# Patient Record
Sex: Female | Born: 1970 | Race: White | Hispanic: No | Smoking: Former smoker
Health system: Southern US, Community
[De-identification: ages and names within clinical notes are randomized; demographics above are authoritative.]

## PROBLEM LIST (undated history)

## (undated) DIAGNOSIS — O24419 Gestational diabetes mellitus in pregnancy, unspecified control: Secondary | ICD-10-CM

## (undated) DIAGNOSIS — J189 Pneumonia, unspecified organism: Secondary | ICD-10-CM

## (undated) DIAGNOSIS — K635 Polyp of colon: Secondary | ICD-10-CM

## (undated) DIAGNOSIS — F32A Depression, unspecified: Secondary | ICD-10-CM

## (undated) DIAGNOSIS — F329 Major depressive disorder, single episode, unspecified: Secondary | ICD-10-CM

## (undated) DIAGNOSIS — T7840XA Allergy, unspecified, initial encounter: Secondary | ICD-10-CM

## (undated) DIAGNOSIS — I10 Essential (primary) hypertension: Secondary | ICD-10-CM

## (undated) DIAGNOSIS — U071 COVID-19: Secondary | ICD-10-CM

## (undated) DIAGNOSIS — K589 Irritable bowel syndrome without diarrhea: Secondary | ICD-10-CM

## (undated) DIAGNOSIS — M1990 Unspecified osteoarthritis, unspecified site: Secondary | ICD-10-CM

## (undated) DIAGNOSIS — F419 Anxiety disorder, unspecified: Secondary | ICD-10-CM

## (undated) HISTORY — DX: COVID-19: U07.1

## (undated) HISTORY — DX: Anxiety disorder, unspecified: F41.9

## (undated) HISTORY — DX: Allergy, unspecified, initial encounter: T78.40XA

## (undated) HISTORY — DX: Unspecified osteoarthritis, unspecified site: M19.90

## (undated) HISTORY — DX: Pneumonia, unspecified organism: J18.9

## (undated) HISTORY — DX: Irritable bowel syndrome, unspecified: K58.9

## (undated) HISTORY — DX: Major depressive disorder, single episode, unspecified: F32.9

## (undated) HISTORY — DX: Essential (primary) hypertension: I10

## (undated) HISTORY — DX: Depression, unspecified: F32.A

## (undated) HISTORY — PX: ESOPHAGOGASTRODUODENOSCOPY: SHX1529

## (undated) HISTORY — DX: Polyp of colon: K63.5

## (undated) HISTORY — DX: Gestational diabetes mellitus in pregnancy, unspecified control: O24.419

---

## 2011-11-20 ENCOUNTER — Ambulatory Visit (INDEPENDENT_AMBULATORY_CARE_PROVIDER_SITE_OTHER): Payer: BC Managed Care – PPO | Admitting: Licensed Clinical Social Worker

## 2011-11-20 DIAGNOSIS — F4323 Adjustment disorder with mixed anxiety and depressed mood: Secondary | ICD-10-CM

## 2011-11-28 ENCOUNTER — Ambulatory Visit (INDEPENDENT_AMBULATORY_CARE_PROVIDER_SITE_OTHER): Payer: BC Managed Care – PPO | Admitting: Licensed Clinical Social Worker

## 2011-11-28 DIAGNOSIS — F4323 Adjustment disorder with mixed anxiety and depressed mood: Secondary | ICD-10-CM

## 2011-12-05 ENCOUNTER — Ambulatory Visit (INDEPENDENT_AMBULATORY_CARE_PROVIDER_SITE_OTHER): Payer: BC Managed Care – PPO | Admitting: Licensed Clinical Social Worker

## 2011-12-05 DIAGNOSIS — F4323 Adjustment disorder with mixed anxiety and depressed mood: Secondary | ICD-10-CM

## 2011-12-11 ENCOUNTER — Ambulatory Visit (INDEPENDENT_AMBULATORY_CARE_PROVIDER_SITE_OTHER): Payer: BC Managed Care – PPO | Admitting: Licensed Clinical Social Worker

## 2011-12-11 DIAGNOSIS — F4323 Adjustment disorder with mixed anxiety and depressed mood: Secondary | ICD-10-CM

## 2011-12-20 ENCOUNTER — Ambulatory Visit (INDEPENDENT_AMBULATORY_CARE_PROVIDER_SITE_OTHER): Payer: BC Managed Care – PPO | Admitting: Licensed Clinical Social Worker

## 2011-12-20 DIAGNOSIS — F4323 Adjustment disorder with mixed anxiety and depressed mood: Secondary | ICD-10-CM

## 2012-01-03 ENCOUNTER — Ambulatory Visit (INDEPENDENT_AMBULATORY_CARE_PROVIDER_SITE_OTHER): Payer: BC Managed Care – PPO | Admitting: Licensed Clinical Social Worker

## 2012-01-03 DIAGNOSIS — F4323 Adjustment disorder with mixed anxiety and depressed mood: Secondary | ICD-10-CM

## 2012-01-16 ENCOUNTER — Ambulatory Visit (INDEPENDENT_AMBULATORY_CARE_PROVIDER_SITE_OTHER): Payer: BC Managed Care – PPO | Admitting: Licensed Clinical Social Worker

## 2012-01-16 DIAGNOSIS — F4323 Adjustment disorder with mixed anxiety and depressed mood: Secondary | ICD-10-CM

## 2012-02-12 ENCOUNTER — Ambulatory Visit (INDEPENDENT_AMBULATORY_CARE_PROVIDER_SITE_OTHER): Payer: BC Managed Care – PPO | Admitting: Licensed Clinical Social Worker

## 2012-02-12 DIAGNOSIS — F4323 Adjustment disorder with mixed anxiety and depressed mood: Secondary | ICD-10-CM

## 2012-02-27 ENCOUNTER — Ambulatory Visit (INDEPENDENT_AMBULATORY_CARE_PROVIDER_SITE_OTHER): Payer: BC Managed Care – PPO | Admitting: Licensed Clinical Social Worker

## 2012-02-27 DIAGNOSIS — F4323 Adjustment disorder with mixed anxiety and depressed mood: Secondary | ICD-10-CM

## 2012-03-13 ENCOUNTER — Ambulatory Visit (INDEPENDENT_AMBULATORY_CARE_PROVIDER_SITE_OTHER): Payer: BC Managed Care – PPO | Admitting: Licensed Clinical Social Worker

## 2012-03-13 DIAGNOSIS — F4323 Adjustment disorder with mixed anxiety and depressed mood: Secondary | ICD-10-CM

## 2012-03-27 ENCOUNTER — Ambulatory Visit (INDEPENDENT_AMBULATORY_CARE_PROVIDER_SITE_OTHER): Payer: BC Managed Care – PPO | Admitting: Licensed Clinical Social Worker

## 2012-03-27 DIAGNOSIS — F4323 Adjustment disorder with mixed anxiety and depressed mood: Secondary | ICD-10-CM

## 2012-04-10 ENCOUNTER — Ambulatory Visit (INDEPENDENT_AMBULATORY_CARE_PROVIDER_SITE_OTHER): Payer: BC Managed Care – PPO | Admitting: Licensed Clinical Social Worker

## 2012-04-10 DIAGNOSIS — F4323 Adjustment disorder with mixed anxiety and depressed mood: Secondary | ICD-10-CM

## 2012-04-23 ENCOUNTER — Ambulatory Visit (INDEPENDENT_AMBULATORY_CARE_PROVIDER_SITE_OTHER): Payer: BC Managed Care – PPO | Admitting: Licensed Clinical Social Worker

## 2012-04-23 DIAGNOSIS — F4323 Adjustment disorder with mixed anxiety and depressed mood: Secondary | ICD-10-CM

## 2012-05-07 ENCOUNTER — Ambulatory Visit (INDEPENDENT_AMBULATORY_CARE_PROVIDER_SITE_OTHER): Payer: BC Managed Care – PPO | Admitting: Licensed Clinical Social Worker

## 2012-05-07 DIAGNOSIS — F4323 Adjustment disorder with mixed anxiety and depressed mood: Secondary | ICD-10-CM

## 2012-06-02 ENCOUNTER — Ambulatory Visit (INDEPENDENT_AMBULATORY_CARE_PROVIDER_SITE_OTHER): Payer: BC Managed Care – PPO | Admitting: Licensed Clinical Social Worker

## 2012-06-02 DIAGNOSIS — F4323 Adjustment disorder with mixed anxiety and depressed mood: Secondary | ICD-10-CM

## 2012-06-24 ENCOUNTER — Ambulatory Visit (INDEPENDENT_AMBULATORY_CARE_PROVIDER_SITE_OTHER): Payer: BC Managed Care – PPO | Admitting: Licensed Clinical Social Worker

## 2012-06-24 DIAGNOSIS — F4323 Adjustment disorder with mixed anxiety and depressed mood: Secondary | ICD-10-CM

## 2012-07-10 ENCOUNTER — Ambulatory Visit: Payer: BC Managed Care – PPO | Admitting: Licensed Clinical Social Worker

## 2012-10-29 ENCOUNTER — Ambulatory Visit (INDEPENDENT_AMBULATORY_CARE_PROVIDER_SITE_OTHER): Payer: BC Managed Care – PPO | Admitting: Licensed Clinical Social Worker

## 2012-10-29 DIAGNOSIS — F4323 Adjustment disorder with mixed anxiety and depressed mood: Secondary | ICD-10-CM

## 2012-11-20 ENCOUNTER — Ambulatory Visit: Payer: BC Managed Care – PPO | Admitting: Licensed Clinical Social Worker

## 2012-11-27 ENCOUNTER — Ambulatory Visit (INDEPENDENT_AMBULATORY_CARE_PROVIDER_SITE_OTHER): Payer: BC Managed Care – PPO | Admitting: Licensed Clinical Social Worker

## 2012-11-27 DIAGNOSIS — F4323 Adjustment disorder with mixed anxiety and depressed mood: Secondary | ICD-10-CM

## 2013-01-01 ENCOUNTER — Ambulatory Visit (INDEPENDENT_AMBULATORY_CARE_PROVIDER_SITE_OTHER): Payer: BC Managed Care – PPO | Admitting: Licensed Clinical Social Worker

## 2013-01-01 DIAGNOSIS — F4323 Adjustment disorder with mixed anxiety and depressed mood: Secondary | ICD-10-CM

## 2013-02-05 ENCOUNTER — Ambulatory Visit: Payer: BC Managed Care – PPO | Admitting: Licensed Clinical Social Worker

## 2013-10-28 ENCOUNTER — Ambulatory Visit (INDEPENDENT_AMBULATORY_CARE_PROVIDER_SITE_OTHER): Payer: BC Managed Care – PPO | Admitting: Licensed Clinical Social Worker

## 2013-10-28 DIAGNOSIS — F4323 Adjustment disorder with mixed anxiety and depressed mood: Secondary | ICD-10-CM

## 2013-11-24 ENCOUNTER — Ambulatory Visit (INDEPENDENT_AMBULATORY_CARE_PROVIDER_SITE_OTHER): Payer: BC Managed Care – PPO | Admitting: Licensed Clinical Social Worker

## 2013-11-24 DIAGNOSIS — F4323 Adjustment disorder with mixed anxiety and depressed mood: Secondary | ICD-10-CM

## 2013-12-02 ENCOUNTER — Ambulatory Visit (INDEPENDENT_AMBULATORY_CARE_PROVIDER_SITE_OTHER): Payer: BC Managed Care – PPO | Admitting: Licensed Clinical Social Worker

## 2013-12-02 DIAGNOSIS — F4323 Adjustment disorder with mixed anxiety and depressed mood: Secondary | ICD-10-CM

## 2013-12-17 ENCOUNTER — Ambulatory Visit (INDEPENDENT_AMBULATORY_CARE_PROVIDER_SITE_OTHER): Payer: BC Managed Care – PPO | Admitting: Licensed Clinical Social Worker

## 2013-12-17 DIAGNOSIS — F4323 Adjustment disorder with mixed anxiety and depressed mood: Secondary | ICD-10-CM

## 2014-01-06 ENCOUNTER — Ambulatory Visit (INDEPENDENT_AMBULATORY_CARE_PROVIDER_SITE_OTHER): Payer: BC Managed Care – PPO | Admitting: Licensed Clinical Social Worker

## 2014-01-06 DIAGNOSIS — F4323 Adjustment disorder with mixed anxiety and depressed mood: Secondary | ICD-10-CM

## 2014-02-03 ENCOUNTER — Ambulatory Visit (INDEPENDENT_AMBULATORY_CARE_PROVIDER_SITE_OTHER): Payer: BC Managed Care – PPO | Admitting: Licensed Clinical Social Worker

## 2014-02-03 DIAGNOSIS — F4323 Adjustment disorder with mixed anxiety and depressed mood: Secondary | ICD-10-CM

## 2014-02-23 ENCOUNTER — Ambulatory Visit (INDEPENDENT_AMBULATORY_CARE_PROVIDER_SITE_OTHER): Payer: BC Managed Care – PPO | Admitting: Licensed Clinical Social Worker

## 2014-02-23 DIAGNOSIS — F4323 Adjustment disorder with mixed anxiety and depressed mood: Secondary | ICD-10-CM

## 2014-03-17 ENCOUNTER — Ambulatory Visit (INDEPENDENT_AMBULATORY_CARE_PROVIDER_SITE_OTHER): Payer: BC Managed Care – PPO | Admitting: Licensed Clinical Social Worker

## 2014-03-17 DIAGNOSIS — F4323 Adjustment disorder with mixed anxiety and depressed mood: Secondary | ICD-10-CM

## 2014-04-08 ENCOUNTER — Ambulatory Visit (INDEPENDENT_AMBULATORY_CARE_PROVIDER_SITE_OTHER): Payer: BC Managed Care – PPO | Admitting: Licensed Clinical Social Worker

## 2014-04-08 DIAGNOSIS — F4323 Adjustment disorder with mixed anxiety and depressed mood: Secondary | ICD-10-CM

## 2014-05-04 ENCOUNTER — Ambulatory Visit: Payer: BC Managed Care – PPO | Admitting: Licensed Clinical Social Worker

## 2014-05-13 ENCOUNTER — Ambulatory Visit (INDEPENDENT_AMBULATORY_CARE_PROVIDER_SITE_OTHER): Payer: BC Managed Care – PPO | Admitting: Licensed Clinical Social Worker

## 2014-05-13 DIAGNOSIS — F4323 Adjustment disorder with mixed anxiety and depressed mood: Secondary | ICD-10-CM

## 2014-06-10 ENCOUNTER — Ambulatory Visit: Payer: BC Managed Care – PPO | Admitting: Licensed Clinical Social Worker

## 2014-06-16 ENCOUNTER — Ambulatory Visit (INDEPENDENT_AMBULATORY_CARE_PROVIDER_SITE_OTHER): Payer: BC Managed Care – PPO | Admitting: Licensed Clinical Social Worker

## 2014-06-16 DIAGNOSIS — F4323 Adjustment disorder with mixed anxiety and depressed mood: Secondary | ICD-10-CM

## 2014-07-21 ENCOUNTER — Ambulatory Visit: Payer: BC Managed Care – PPO | Admitting: Licensed Clinical Social Worker

## 2014-12-14 ENCOUNTER — Ambulatory Visit (INDEPENDENT_AMBULATORY_CARE_PROVIDER_SITE_OTHER): Payer: BC Managed Care – PPO | Admitting: Licensed Clinical Social Worker

## 2014-12-14 DIAGNOSIS — F4323 Adjustment disorder with mixed anxiety and depressed mood: Secondary | ICD-10-CM | POA: Diagnosis not present

## 2014-12-28 ENCOUNTER — Ambulatory Visit (INDEPENDENT_AMBULATORY_CARE_PROVIDER_SITE_OTHER): Payer: BC Managed Care – PPO | Admitting: Licensed Clinical Social Worker

## 2014-12-28 DIAGNOSIS — F4323 Adjustment disorder with mixed anxiety and depressed mood: Secondary | ICD-10-CM | POA: Diagnosis not present

## 2015-01-11 ENCOUNTER — Ambulatory Visit (INDEPENDENT_AMBULATORY_CARE_PROVIDER_SITE_OTHER): Payer: BC Managed Care – PPO | Admitting: Licensed Clinical Social Worker

## 2015-01-11 DIAGNOSIS — F4323 Adjustment disorder with mixed anxiety and depressed mood: Secondary | ICD-10-CM | POA: Diagnosis not present

## 2016-03-30 ENCOUNTER — Ambulatory Visit (INDEPENDENT_AMBULATORY_CARE_PROVIDER_SITE_OTHER): Payer: BC Managed Care – PPO | Admitting: Family Medicine

## 2016-03-30 ENCOUNTER — Encounter: Payer: Self-pay | Admitting: Family Medicine

## 2016-03-30 VITALS — BP 116/76 | HR 74 | Temp 98.6°F | Ht 66.5 in | Wt 181.6 lb

## 2016-03-30 DIAGNOSIS — E663 Overweight: Secondary | ICD-10-CM

## 2016-03-30 DIAGNOSIS — G43709 Chronic migraine without aura, not intractable, without status migrainosus: Secondary | ICD-10-CM

## 2016-03-30 DIAGNOSIS — G43909 Migraine, unspecified, not intractable, without status migrainosus: Secondary | ICD-10-CM | POA: Insufficient documentation

## 2016-03-30 DIAGNOSIS — R0982 Postnasal drip: Secondary | ICD-10-CM | POA: Insufficient documentation

## 2016-03-30 DIAGNOSIS — M21619 Bunion of unspecified foot: Secondary | ICD-10-CM

## 2016-03-30 MED ORDER — VENLAFAXINE HCL ER 37.5 MG PO CP24: ORAL_CAPSULE | ORAL | 0 refills | Status: DC

## 2016-03-30 NOTE — Progress Notes (Signed)
   HPI  Patient presents today here to establish care.  Headaches Start with headaches for years, she previously took Topamax for about 2 years, however you to mental dullness stopped it. She is also using a beta blocker. She was controlled with Topamax and beta blocker. Good treatment with Imitrex Describes right-sided and sometimes bitemporal throbbing headaches that last all day, associated photophobia, worsening with sounds or smells, no aura. She has 3-4 migraines per week  Bunion Painful right foot bunion for over a year Difficulty with running Would like to have this addressed.  Complains of 2-3 months plus of intermittent right ear and right sore throat pain that started after each strip She does feel like she has nasal congestion and postnasal drip. She has not tried allergy medications.  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  PMH: Smoking status noted Medical history significant for allergy, anxiety, depression, gestational diabetes, hypertension Family history positive for dementia hyperlipidemia, hypertension Previous surgeries Former smoker, no drug use ROS: Per HPI  Objective: BP 116/76   Pulse 74   Temp 98.6 F (37 C) (Oral)   Ht 5' 6.5" (1.689 m)   Wt 181 lb 9.6 oz (82.4 kg)   BMI 28.87 kg/m  Gen: NAD, alert, cooperative with exam HEENT: NCAT, EOMI, PERRL, nares with some swelling and erythema, oropharynx clear, TMs normal bilaterally CV: RRR, good S1/S2, no murmur Resp: CTABL, no wheezes, non-labored Abd: SNTND, BS present, no guarding or organomegaly Ext: No edema, warm, large bunion on the first MTP of the right foot Neuro: Alert and oriented, No gross deficits  Assessment and plan:  # Migraines Worsening Starting prophylactic treatment with Effexor Continue Imitrex Discussed diet control Consider neurology referral if no improvement  # Overweight She is exercising and watching her diet Continue to monitor with starting Effexor Labs,  TSH  # Postnasal drip Flonase   # Bunion Painful bunion, physical exam significant for large bunion Likely will need a podiatrist attention, referral placed     Orders Placed This Encounter  Procedures  . Ambulatory referral to Podiatry    Referral Priority:   Routine    Referral Type:   Consultation    Referral Reason:   Specialty Services Required    Requested Specialty:   Podiatry    Number of Visits Requested:   1    Meds ordered this encounter  Medications  . SUMAtriptan (IMITREX) 100 MG tablet  . venlafaxine XR (EFFEXOR XR) 37.5 MG 24 hr capsule    Sig: 1 capsule daily for 1 week then 2 capsules daily    Dispense:  60 capsule    Refill:  0    Murtis SinkSam Camree Wigington, MD [facility] 03/30/2016, 3:02 PM

## 2016-03-30 NOTE — Patient Instructions (Signed)
Great to see you!  Start effexor for migraine prophylaxis, give this several weeks to kick in fully  Try flonase 2 sprays per nostril once daily for intermittent sore throat and ear pain.   We will call when a  Podiatrist is arranged.   Come back in 1 month

## 2016-04-23 ENCOUNTER — Encounter: Payer: Self-pay | Admitting: Family Medicine

## 2016-04-23 ENCOUNTER — Ambulatory Visit (INDEPENDENT_AMBULATORY_CARE_PROVIDER_SITE_OTHER): Payer: BC Managed Care – PPO | Admitting: Family Medicine

## 2016-04-23 VITALS — BP 120/76 | HR 72 | Temp 97.9°F | Ht 66.5 in | Wt 181.6 lb

## 2016-04-23 DIAGNOSIS — M21619 Bunion of unspecified foot: Secondary | ICD-10-CM | POA: Diagnosis not present

## 2016-04-23 DIAGNOSIS — G43709 Chronic migraine without aura, not intractable, without status migrainosus: Secondary | ICD-10-CM

## 2016-04-23 DIAGNOSIS — R0982 Postnasal drip: Secondary | ICD-10-CM | POA: Diagnosis not present

## 2016-04-23 MED ORDER — VENLAFAXINE HCL ER 75 MG PO CP24: 75.0000 mg | ORAL_CAPSULE | Freq: Every day | ORAL | 3 refills | Status: DC

## 2016-04-23 NOTE — Progress Notes (Signed)
   HPI  Patient presents today here to follow-up for headaches come bunion, postnasal drip.  Postnasal drip Symptoms stable to improved, using Nasacort No daily antihistamine.  Headache Doing better, having about one half as many headaches that she did previously. Tolerating Effexor without problem, no suicidal thoughts or mood disturbance.  Bunion Patient is down because she is been told by podiatry she needs to stop running, she is considering taking up cycling.   PMH: Smoking status noted ROS: Per HPI  Objective: BP 120/76   Pulse 72   Temp 97.9 F (36.6 C) (Oral)   Ht 5' 6.5" (1.689 m)   Wt 181 lb 9.6 oz (82.4 kg)   BMI 28.87 kg/m  Gen: NAD, alert, cooperative with exam HEENT: NCAT, EOMI, PERRL CV: RRR, good S1/S2, no murmur Resp: CTABL, no wheezes, non-labored Abd: SNTND, BS present, no guarding or organomegaly Ext: No edema, warm Neuro: Alert and oriented, No gross deficits  Assessment and plan:  # Headache Improving on Effexor, continue Follow-up 2 months Consider Depakote if needed, previously did not tolerate Topamax  # Bunion Limiting physical exercise, patient's understandably concerned about this Discussed alternative lower impact exercises Symptoms stable, appreciate podiatry's recommendations and management  # Postnasal drip Continue Nasacort, add daily antihistamine- Zyrtec    Meds ordered this encounter  Medications  . Triamcinolone Acetonide (NASACORT ALLERGY 24HR NA)    Sig: Place into the nose.  . venlafaxine XR (EFFEXOR-XR) 75 MG 24 hr capsule    Sig: Take 1 capsule (75 mg total) by mouth daily with breakfast.    Dispense:  30 capsule    Refill:  3    Murtis SinkSam Kurtiss Wence, MD [facility] 04/23/2016, 9:13 AM

## 2016-04-23 NOTE — Addendum Note (Signed)
Addended by: Prescott GumLAND, Deatra Mcmahen M on: 04/23/2016 09:25 AM   Modules accepted: Orders

## 2016-04-23 NOTE — Patient Instructions (Signed)
Great to see you!  Try plain zyrtec ( store brand is fine) 1 pill once daily.   Come back in 2 months

## 2016-04-24 LAB — CBC WITH DIFFERENTIAL/PLATELET
BASOS ABS: 0.1 10*3/uL (ref 0.0–0.2)
BASOS: 1 %
EOS (ABSOLUTE): 0.1 10*3/uL (ref 0.0–0.4)
Eos: 1 %
Hematocrit: 40.1 % (ref 34.0–46.6)
Hemoglobin: 14.1 g/dL (ref 11.1–15.9)
IMMATURE GRANS (ABS): 0 10*3/uL (ref 0.0–0.1)
IMMATURE GRANULOCYTES: 1 %
LYMPHS: 34 %
Lymphocytes Absolute: 2.1 10*3/uL (ref 0.7–3.1)
MCH: 29.7 pg (ref 26.6–33.0)
MCHC: 35.2 g/dL (ref 31.5–35.7)
MCV: 85 fL (ref 79–97)
MONOS ABS: 0.4 10*3/uL (ref 0.1–0.9)
Monocytes: 6 %
NEUTROS PCT: 57 %
Neutrophils Absolute: 3.6 10*3/uL (ref 1.4–7.0)
PLATELETS: 205 10*3/uL (ref 150–379)
RBC: 4.74 x10E6/uL (ref 3.77–5.28)
RDW: 12.6 % (ref 12.3–15.4)
WBC: 6.2 10*3/uL (ref 3.4–10.8)

## 2016-04-24 LAB — CMP14+EGFR
A/G RATIO: 1.6 (ref 1.2–2.2)
ALBUMIN: 4.5 g/dL (ref 3.5–5.5)
ALT: 22 IU/L (ref 0–32)
AST: 17 IU/L (ref 0–40)
Alkaline Phosphatase: 72 IU/L (ref 39–117)
BUN/Creatinine Ratio: 21 (ref 9–23)
BUN: 16 mg/dL (ref 6–24)
Bilirubin Total: 0.5 mg/dL (ref 0.0–1.2)
CALCIUM: 9.4 mg/dL (ref 8.7–10.2)
CO2: 23 mmol/L (ref 18–29)
CREATININE: 0.75 mg/dL (ref 0.57–1.00)
Chloride: 102 mmol/L (ref 96–106)
GFR, EST AFRICAN AMERICAN: 111 mL/min/{1.73_m2} (ref >59)
GFR, EST NON AFRICAN AMERICAN: 96 mL/min/{1.73_m2} (ref >59)
GLOBULIN, TOTAL: 2.8 g/dL (ref 1.5–4.5)
Glucose: 91 mg/dL (ref 65–99)
POTASSIUM: 4.4 mmol/L (ref 3.5–5.2)
SODIUM: 142 mmol/L (ref 134–144)
TOTAL PROTEIN: 7.3 g/dL (ref 6.0–8.5)

## 2016-04-24 LAB — LIPID PANEL
CHOLESTEROL TOTAL: 231 mg/dL — AB (ref 100–199)
Chol/HDL Ratio: 3 ratio units (ref 0.0–4.4)
HDL: 76 mg/dL (ref >39)
LDL Calculated: 138 mg/dL — ABNORMAL HIGH (ref 0–99)
TRIGLYCERIDES: 85 mg/dL (ref 0–149)
VLDL CHOLESTEROL CAL: 17 mg/dL (ref 5–40)

## 2016-04-24 LAB — TSH: TSH: 1.95 u[IU]/mL (ref 0.450–4.500)

## 2016-06-25 ENCOUNTER — Ambulatory Visit: Payer: BC Managed Care – PPO | Admitting: Family Medicine

## 2016-07-17 ENCOUNTER — Ambulatory Visit (INDEPENDENT_AMBULATORY_CARE_PROVIDER_SITE_OTHER): Payer: BC Managed Care – PPO | Admitting: Family Medicine

## 2016-07-17 ENCOUNTER — Encounter: Payer: Self-pay | Admitting: Family Medicine

## 2016-07-17 VITALS — BP 129/88 | HR 78 | Temp 97.9°F | Ht 66.5 in | Wt 190.2 lb

## 2016-07-17 DIAGNOSIS — G43709 Chronic migraine without aura, not intractable, without status migrainosus: Secondary | ICD-10-CM

## 2016-07-17 DIAGNOSIS — R635 Abnormal weight gain: Secondary | ICD-10-CM | POA: Diagnosis not present

## 2016-07-17 MED ORDER — VENLAFAXINE HCL ER 150 MG PO CP24: 150.0000 mg | ORAL_CAPSULE | Freq: Every day | ORAL | 3 refills | Status: DC

## 2016-07-17 NOTE — Progress Notes (Signed)
   HPI  Patient presents today for follow-up of chronic migraines.  Patient feels that Effexor is helping. She is now down to an average of one to 2 headaches per week. She had a severe headache during a recent snowstorm, she feels this is weather related. Sumatriptan is usually very effective. She has noticed a link between chocolate and migraines.  She still uses lots of caffeine.  Weight gain.  Patient has painful bunions, this has limited her exercise. She's watching her diet moderately. He is trying cycling but hasn't began to enjoy it yet.  PMH: Smoking status noted ROS: Per HPI  Objective: BP 129/88   Pulse 78   Temp 97.9 F (36.6 C) (Oral)   Ht 5' 6.5" (1.689 m)   Wt 190 lb 3.2 oz (86.3 kg)   BMI 30.24 kg/m  Gen: NAD, alert, cooperative with exam HEENT: NCAT CV: RRR, good S1/S2, no murmur Resp: CTABL, no wheezes, non-labored Ext: No edema, warm Neuro: Alert and oriented, No gross deficits  Assessment and plan:  # Chronic migraine Increase Effexor to 150 mg once daily, patient is down to average of 2 headaches per week, Continue Imitrex as needed Consider stopping caffeine completely   # Weight gain Discussed several lifestyle changes Cut out diet sodas, try spinning   Meds ordered this encounter  Medications  . venlafaxine XR (EFFEXOR-XR) 150 MG 24 hr capsule    Sig: Take 1 capsule (150 mg total) by mouth daily with breakfast.    Dispense:  30 capsule    Refill:  3    Murtis SinkSam Havish Petties, MD [facility] 07/17/2016, 5:06 PM

## 2016-07-17 NOTE — Patient Instructions (Signed)
Great to see you!  I have increased your effexor to 150 mg once daily.   Come back in 3-4 months for follow up of migraine headaches

## 2016-11-15 ENCOUNTER — Ambulatory Visit: Payer: BC Managed Care – PPO | Admitting: Family Medicine

## 2016-11-19 ENCOUNTER — Telehealth: Payer: Self-pay | Admitting: Family Medicine

## 2016-11-19 ENCOUNTER — Encounter: Payer: Self-pay | Admitting: Family Medicine

## 2016-11-19 NOTE — Telephone Encounter (Signed)
Left message to call back to r/s

## 2016-11-27 ENCOUNTER — Encounter: Payer: Self-pay | Admitting: Family Medicine

## 2016-11-27 ENCOUNTER — Ambulatory Visit (INDEPENDENT_AMBULATORY_CARE_PROVIDER_SITE_OTHER): Payer: BC Managed Care – PPO | Admitting: Family Medicine

## 2016-11-27 VITALS — BP 117/82 | HR 94 | Temp 98.0°F | Ht 66.5 in | Wt 192.2 lb

## 2016-11-27 DIAGNOSIS — R635 Abnormal weight gain: Secondary | ICD-10-CM

## 2016-11-27 DIAGNOSIS — N951 Menopausal and female climacteric states: Secondary | ICD-10-CM

## 2016-11-27 DIAGNOSIS — G43709 Chronic migraine without aura, not intractable, without status migrainosus: Secondary | ICD-10-CM

## 2016-11-27 MED ORDER — VENLAFAXINE HCL ER 37.5 MG PO CP24: ORAL_CAPSULE | ORAL | 0 refills | Status: DC

## 2016-11-27 MED ORDER — METOPROLOL SUCCINATE ER 50 MG PO TB24: 50.0000 mg | ORAL_TABLET | Freq: Every day | ORAL | 3 refills | Status: DC

## 2016-11-27 NOTE — Patient Instructions (Signed)
Great to see you!  Stop venlafaxine, ( take 75 mg daily for 2 weeks, then 37.5 mg for 2 weeks, then stop)  Start metoprolol 1 pill once daily.   Come back in 4-6 weeks.

## 2016-11-27 NOTE — Progress Notes (Signed)
   HPI  Patient presents today here to discuss headaches, weight gain  Weight gain Patient is previously very physically active, she has had some changes in that due to osteoarthritis of the right foot. She still watching her diet moderately. She states that she's gained about 20 pounds in one year.  Migraines Has had good improvement with Effexor, however concerned about leaking. Patient was doing much better for while, now she's having about one headache a day for the last month. She has tried Topamax which helped for a time and then quit. She does not want to try Topamax.    PMH: Smoking status noted ROS: Per HPI  Objective: BP 117/82   Pulse 94   Temp 98 F (36.7 C) (Oral)   Ht 5' 6.5" (1.689 m)   Wt 192 lb 3.2 oz (87.2 kg)   BMI 30.56 kg/m  Gen: NAD, alert, cooperative with exam HEENT: NCAT CV: RRR, good S1/S2, no murmur Resp: CTABL, no wheezes, non-labored Ext: No edema, warm Neuro: Alert and oriented, No gross deficits  Assessment and plan:  # Migraine headaches Stopping venlafaxine due to decreased effectiveness and concern about weight gain Cut down by half for 2 weeks, half again for 2 weeks then stop. Starting Toprol-XL 50 mg once daily. Offered headache clinic referral  # Weight gain Multifactorial Discussed venlafaxine can have a part of this Encouraged therapeutic left cell changes, discontinue venlafaxine.  Perimenopausal Long discussion about irregular periods, her mother went through menopause in her early 5650s, she's a few years earlier than that. Also with some hot flashes. Patient plans to see her GYN to discuss further  Meds ordered this encounter  Medications  . venlafaxine XR (EFFEXOR-XR) 37.5 MG 24 hr capsule    Sig: 2 pills daily for 2 weeks, then 1 pill daily for 2 weeks then stop    Dispense:  42 capsule    Refill:  0  . metoprolol succinate (TOPROL-XL) 50 MG 24 hr tablet    Sig: Take 1 tablet (50 mg total) by mouth daily. Take  with or immediately following a meal.    Dispense:  30 tablet    Refill:  3    Murtis SinkSam Bradshaw, MD [facility] 11/27/2016, 9:42 AM

## 2016-11-29 ENCOUNTER — Encounter: Payer: Self-pay | Admitting: Family Medicine

## 2016-11-29 ENCOUNTER — Encounter: Payer: Self-pay | Admitting: Neurology

## 2016-11-29 DIAGNOSIS — G43809 Other migraine, not intractable, without status migrainosus: Secondary | ICD-10-CM

## 2016-12-10 ENCOUNTER — Ambulatory Visit (INDEPENDENT_AMBULATORY_CARE_PROVIDER_SITE_OTHER): Payer: BC Managed Care – PPO | Admitting: Pediatrics

## 2016-12-10 ENCOUNTER — Encounter: Payer: Self-pay | Admitting: Pediatrics

## 2016-12-10 VITALS — BP 123/85 | HR 68 | Temp 98.7°F | Ht 66.5 in | Wt 193.0 lb

## 2016-12-10 DIAGNOSIS — L237 Allergic contact dermatitis due to plants, except food: Secondary | ICD-10-CM | POA: Diagnosis not present

## 2016-12-10 MED ORDER — HYDROCORTISONE 2.5 % EX CREA: TOPICAL_CREAM | Freq: Two times a day (BID) | CUTANEOUS | 0 refills | Status: DC

## 2016-12-10 NOTE — Patient Instructions (Signed)
Apply twice a day Wear sunscreen on areas with steroid because can cause sun sensitivity

## 2016-12-10 NOTE — Progress Notes (Signed)
  Subjective:   Patient ID: Sierra Pittman, female    DOB: 12/12/1970, 46 y.o.   MRN: [id number] CC: Poison Ivy (all over, one spot near eye started about week ago)  HPI: Sierra PikeHolly Cobert is a 46 y.o. female presenting for Poison Ivy (all over, one spot near eye started about week ago)  Has itchy areas in streaks on legs One place side of nose near L eye showed up yesterday Has been outside doing yard work regularly Using calamine, OTC hydrocortisone Helped some, but still feeling itchy off and on  No eye pain, no pain in itchy areas  Relevant past medical, surgical, family and social history reviewed. Allergies and medications reviewed and updated. History  Smoking Status  . Former Smoker  Smokeless Tobacco  . Never Used   ROS: Per HPI   Objective:    BP 123/85   Pulse 68   Temp 98.7 F (37.1 C) (Oral)   Ht 5' 6.5" (1.689 m)   Wt 193 lb (87.5 kg)   BMI 30.68 kg/m   Wt Readings from Last 3 Encounters:  12/10/16 193 lb (87.5 kg)  11/27/16 192 lb 3.2 oz (87.2 kg)  07/17/16 190 lb 3.2 oz (86.3 kg)    Gen: NAD, alert, cooperative with exam, NCAT EYES: EOMI, no conjunctival injection, or no icterus ENT:  TMs pearly gray b/l, OP without erythema LYMPH: no cervical LAD CV: WWP Resp:  normal WOB Ext: No edema, warm Neuro: Alert and oriented, strength equal b/l UE and LE, coordination grossly normal MSK: normal muscle bulk Skin: 1cm by 3mm raised slightly pink plaque L upper side of nose Several excorations b/l legs no vesicles   Assessment & Plan:  Jeanice LimHolly was seen today for poison ivy.  Diagnoses and all orders for this visit:  Poison ivy -     hydrocortisone 2.5 % cream; Apply topically 2 (two) times daily.   Follow up plan: Return if symptoms worsen or fail to improve. Rex Krasarol Essex Perry, MD [facility]

## 2016-12-14 ENCOUNTER — Ambulatory Visit: Payer: BC Managed Care – PPO | Admitting: Family

## 2017-01-07 ENCOUNTER — Ambulatory Visit: Payer: BC Managed Care – PPO | Admitting: Family Medicine

## 2017-03-08 ENCOUNTER — Encounter: Payer: Self-pay | Admitting: Neurology

## 2017-03-08 ENCOUNTER — Ambulatory Visit (INDEPENDENT_AMBULATORY_CARE_PROVIDER_SITE_OTHER): Payer: BC Managed Care – PPO | Admitting: Neurology

## 2017-03-08 VITALS — BP 108/80 | HR 72 | Ht 65.0 in | Wt 191.0 lb

## 2017-03-08 DIAGNOSIS — G43709 Chronic migraine without aura, not intractable, without status migrainosus: Secondary | ICD-10-CM | POA: Diagnosis not present

## 2017-03-08 MED ORDER — ONDANSETRON 4 MG PO TBDP: 4.0000 mg | ORAL_TABLET | Freq: Three times a day (TID) | ORAL | 2 refills | Status: DC | PRN

## 2017-03-08 NOTE — Progress Notes (Signed)
NEUROLOGY CONSULTATION NOTE  Sierra Pittman MRN: [id number] DOB: 01/11/1971  Referring provider: Dr. Ermalinda Memos Primary care provider: Dr. Ermalinda Memos  Reason for consult:  migraines  HISTORY OF PRESENT ILLNESS: Sierra Pittman is a 46 year old female who presents for migraine.  History supplemented by PCP note.  Onset:  High school Location:  Either from right trochlear region radiating to the right side or top of head radiating holocephalic Quality:  pounding Intensity:  Usually 5/10, severe 10/10 Aura:  no Prodrome:  no Postdrome:  no Associated symptoms:  Nausea, photophobia, phonophobia, osmophobia.  Severe migraines associated with vomiting..  She has not had any new worse headache of her life, waking up from sleep Duration:  Usually 12 hours untreated (less than 4 hours treated); severe migraines last 3 days Frequency:  4 to 5 days a week; severe migraines occur every few months Frequency of abortive medication: 4-5 days a week Triggers/exacerbating factors:  Smells (disinfectants, perfumes), foods (tree nuts, sausage/bacon), during pregnancy Relieving factors:  Laying down to rest. Activity:  Aggravates.  Cannot function at all if severe.  Past NSAIDS:  Excedrin Migraine Past analgesics:  Tylenol, Excedrin Migraine Past abortive triptans:  no Past muscle relaxants:  no Past anti-emetic:  Zofran (effective) Past antihypertensive medications:  Metoprolol  (took briefly while weaning off venlafaxine- stopped because she wasn't feeling well, but it could have been withdrawal effect from venlafaxine) Past antidepressant medications:  venlafaxine XR  (no longer effective, weight gain) Past anticonvulsant medications:  topiramate (cognitive changes, no longer effective) Past vitamins/Herbal/Supplements:  no Other past therapies:  no  Current NSAIDS:  Ibuprofen (rarely for body aches) Current analgesics:  no Current triptans:  sumatriptan  Current anti-emetic:   no Current muscle relaxants:  no Current anti-anxiolytic:  no Current sleep aide:  no Current Antihypertensive medications:  no Current Antidepressant medications:  no Current Anticonvulsant medications:  no Current Vitamins/Herbal/Supplements:  no Current Antihistamines/Decongestants:  no Other therapy:  no  Caffeine:  3 diet Mt[employer] Dew[employer] daily Alcohol:  rarely Smoker:  no Diet:  Hydrates but needs to improve.  Watches diet. Exercise:  yes Depression/anxiety:  Some anxiety and mild depression Sleep hygiene:  Wakes up in night and cannot fall back asleep Family history of headache:  Mother, maternal grandmother, sister  Most recent labs 04/23/16:  CMP with Na 142, K 4.4, Cl 102, CO2 23, glucose 91, BUN 16, Cr 0.75, t. bili 0.5, ALP 72, AST 17 and ALT 22.  PAST MEDICAL HISTORY: Past Medical History:  Diagnosis Date  . Allergy   . Anxiety   . Depression   . Gestational diabetes   . Hypertension     PAST SURGICAL HISTORY: History reviewed. No pertinent surgical history.  MEDICATIONS: Current Outpatient Prescriptions on File Prior to Visit  Medication Sig Dispense Refill  . hydrocortisone 2.5 % cream Apply topically 2 (two) times daily. (Patient not taking: Reported on 03/08/2017) 30 g 0  . metoprolol succinate (TOPROL-XL) 50 MG 24 hr tablet Take 1 tablet (50 mg total) by mouth daily. Take with or immediately following a meal. (Patient not taking: Reported on 03/08/2017) 30 tablet 3  . SUMAtriptan (IMITREX) 100 MG tablet     . Triamcinolone Acetonide (NASACORT ALLERGY 24HR NA) Place into the nose.     No current facility-administered medications on file prior to visit.     ALLERGIES: Allergies  Allergen Reactions  . Amoxicillin Rash  . Penicillins Rash    FAMILY HISTORY: Family History  Problem Relation Age  of Onset  . Hyperlipidemia Mother   . Hypertension Father   . Diabetes Father   . Hyperlipidemia Father   . Dementia Maternal Grandmother   . Prostate cancer  Maternal Grandfather   . Hypertension Paternal Grandmother   . Dementia Paternal Grandfather     SOCIAL HISTORY: Social History   Social History  . Marital status: Divorced    Spouse name: N/A  . Number of children: 2  . Years of education: N/A   Occupational History  . 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 Smoking status: Former Games developer  . Smokeless tobacco: Never Used  . Alcohol use Yes     Comment: occ  . Drug use: No  . Sexual activity: Not on file   Other Topics Concern  . Not on file   Social History Narrative   Divorced, lives with her 2 children in a 1 story house. Has 1 dog. Drinks 3 diet sodas a day. Exercises 3-5x a week at the gym. Has a bachelors degree. 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    REVIEW OF SYSTEMS: Constitutional: No fevers, chills, or sweats, no generalized fatigue, change in appetite Eyes: No visual changes, double vision, eye pain Ear, nose and throat: No hearing loss, ear pain, nasal congestion, sore throat Cardiovascular: No chest pain, palpitations Respiratory:  No shortness of breath at rest or with exertion, wheezes GastrointestinaI: No nausea, vomiting, diarrhea, abdominal pain, fecal incontinence Genitourinary:  No dysuria, urinary retention or frequency Musculoskeletal:  No neck pain, back pain Integumentary: No rash, pruritus, skin lesions Neurological: as above Psychiatric: No depression, insomnia, anxiety Endocrine: No palpitations, fatigue, diaphoresis, mood swings, change in appetite, change in weight, increased thirst Hematologic/Lymphatic:  No purpura, petechiae. Allergic/Immunologic: no itchy/runny eyes, nasal congestion, recent allergic reactions, rashes  PHYSICAL EXAM: Vitals:   03/08/17 0758  BP: 108/80  Pulse: 72  SpO2: 98%   General: No acute distress.  Patient appears well-groomed.  Head:  Normocephalic/atraumatic Eyes:  fundi examined but not visualized Neck:  supple, no paraspinal tenderness, full range of motion Back: No paraspinal tenderness Heart: regular rate and rhythm Lungs: Clear to auscultation bilaterally. Vascular: No carotid bruits. Neurological Exam: Mental status: alert and oriented to person, place, and time, recent and remote memory intact, fund of knowledge intact, attention and concentration intact, speech fluent and not dysarthric, language intact. Cranial nerves: CN I: not tested CN II: pupils equal, round and reactive to light, visual fields intact CN III, IV, VI:  full range of motion, no nystagmus, no ptosis CN V: facial sensation intact CN VII: upper and lower face symmetric CN VIII: hearing intact CN IX, X: gag intact, uvula midline CN XI: sternocleidomastoid and trapezius muscles intact CN XII: tongue midline Bulk & Tone: normal, no fasciculations. Motor:  5/5 throughout  Sensation: temperature and vibration sensation intact. Deep Tendon Reflexes:  2+ throughout, toes downgoing.  Finger to nose testing:  Without dysmetria.  Heel to shin:  Without dysmetria.  Gait:  Normal station and stride.  Able to turn and tandem walk. Romberg negative.  IMPRESSION: Chronic migraines  PLAN: 1.  Preauthorization for Botox.  She has over 3 months of over 15 headache/migraine days a month and has failed several preventatives (venlafaxine, topiramate, metoprolol) 2.  Sumatriptan  to  earliest onset of headache and may repeat after 2 hours if needed.  Limit to no more than 2 days out of week. 3.  Lifestyle modification:  Caffeine/soda cessation, increase water, continue exercise, sleep hygiene 4.  Consider magnesium, riboflavin, CoQ10 5.  Follow up for botox.  Thank you for allowing me to take part in the care of this patient.  Shon Millet, DO  CC: Kevin Fenton, MD

## 2017-03-08 NOTE — Patient Instructions (Signed)
Migraine Recommendations: 1.  We will get pre-authorization for Botox 2.  Take sumatriptan 50 to  at earliest onset of headache.  May repeat dose once in 2 hours if needed.  Do not exceed two doses in 24 hours.  For nausea, take Zofran ODT as needed/directed. 3.  Limit use of pain relievers to no more than 2 days out of the week.  These medications include acetaminophen, ibuprofen, triptans and narcotics.  This will help reduce risk of rebound headaches. 4.  Be aware of common food triggers such as processed sweets, processed foods with nitrites (such as deli meat, hot dogs, sausages), foods with MSG, alcohol (such as wine), chocolate, certain cheeses, certain fruits (dried fruits, some citrus fruit), vinegar, diet soda. 4.  Avoid caffeine 5.  Routine exercise 6.  Proper sleep hygiene 7.  Stay adequately hydrated with water 8.  Keep a headache diary. 9.  Maintain proper stress management. 10.  Do not skip meals. 11.  Consider supplements:  Magnesium citrate  to  daily, riboflavin , Coenzyme Q 10  three times daily 12.  Follow up for Botox.

## 2017-04-09 ENCOUNTER — Telehealth: Payer: Self-pay | Admitting: Neurology

## 2017-04-09 NOTE — Telephone Encounter (Signed)
Pt called and wanted to know how much her responsibility for the botox appointment she as far as what insurance will cover and not cover, she does not want a big bill when she gets her other wise she will cancel

## 2017-04-10 NOTE — Telephone Encounter (Signed)
Called [employer] spoke with Remi DeterKeira S., initiated PA for Botox. She checked coverage for [id number],[id number]-both are covered determination will take 2-3 business days DX: G43.709, reference #'s for: [id number] [phone] Fax clinicals to #[phone]-faxed I inquired on Pts out of pocket responsibilty, was trx to benefits [phone]) Spoke with Tess-will be buy and bill, Pt will have $85 copay. Called Pt and advsd her.

## 2017-04-12 NOTE — Telephone Encounter (Signed)
[facility], 1 visit valid 04/10/17-12/31/198 [id number] [phone]  Also rcvd a second approval for 1 visit valid 06/04/17-10/08/17 [phone]

## 2017-04-19 ENCOUNTER — Ambulatory Visit (INDEPENDENT_AMBULATORY_CARE_PROVIDER_SITE_OTHER): Payer: BC Managed Care – PPO | Admitting: Neurology

## 2017-04-19 DIAGNOSIS — G43709 Chronic migraine without aura, not intractable, without status migrainosus: Secondary | ICD-10-CM

## 2017-04-19 DIAGNOSIS — IMO0002 Reserved for concepts with insufficient information to code with codable children: Secondary | ICD-10-CM

## 2017-04-19 MED ORDER — ONABOTULINUMTOXINA 100 UNITS IJ SOLR
155.0000 [IU] | Freq: Once | INTRAMUSCULAR | Status: AC
  Administered 2017-04-19: 155 [IU] via INTRAMUSCULAR

## 2017-04-19 NOTE — Progress Notes (Signed)
[facility]   Procedure Note Botox  Attending: Dr. Sadarius Norman  Preoperative Diagnosis(es): Chronic migraine  Consent obtained from: The patient Benefits discussed included, but were not limited to decreased muscle tightness, increased joint range of motion, and decreased pain.  Risk discussed included, but were not limited pain and discomfort, bleeding, bruising, excessive weakness, venous thrombosis, muscle atrophy and dysphagia.  Anticipated outcomes of the procedure as well as he risks and benefits of the alternatives to the procedure, and the roles and tasks of the personnel to be involved, were discussed with the patient, and the patient consents to the procedure and agrees to proceed. A copy of the patient medication guide was given to the patient which explains the blackbox warning.  Patients identity and treatment sites confirmed Yes.  .  Details of Procedure: Skin was cleaned with alcohol. Prior to injection, the needle plunger was aspirated to make sure the needle was not within a blood vessel.  There was no blood retrieved on aspiration.    Following is a summary of the muscles injected  And the amount of Botulinum toxin used:  Dilution 200 units of Botox was reconstituted with 4 ml of preservative free normal saline. Time of reconstitution: At the time of the office visit (<30 minutes prior to injection)   Injections  155 total units of Botox was injected with a 30 gauge needle.  Injection Sites: L occipitalis: 15 units- 3 sites  R occiptalis: 15 units- 3 sites  L upper trapezius: 15 units- 3 sites R upper trapezius: 15 units- 3 sits          L paraspinal: 10 units- 2 sites R paraspinal: 10 units- 2 sites  Face L frontalis(2 injection sites):10 units   R frontalis(2 injection sites):10 units         L corrugator: 5 units   R corrugator: 5 units           Procerus: 5 units   L temporalis: 20 units R temporalis: 20 units   Agent:  200 units of botulinum Type  A (Onobotulinum Toxin type A) was reconstituted with 4 ml of preservative free normal saline.  Time of reconstitution: At the time of the office visit (<30 minutes prior to injection)     Total injected (Units): 155  Total wasted (Units): 45  Patient tolerated procedure well without complications.   Reinjection is anticipated in 3 months. Return to clinic in 4.5 months.  

## 2017-05-21 LAB — HM PAP SMEAR: HM Pap smear: NORMAL

## 2017-07-26 ENCOUNTER — Ambulatory Visit (INDEPENDENT_AMBULATORY_CARE_PROVIDER_SITE_OTHER): Payer: BC Managed Care – PPO | Admitting: Neurology

## 2017-07-26 DIAGNOSIS — G43709 Chronic migraine without aura, not intractable, without status migrainosus: Secondary | ICD-10-CM

## 2017-07-26 MED ORDER — ONABOTULINUMTOXINA 100 UNITS IJ SOLR
155.0000 [IU] | Freq: Once | INTRAMUSCULAR | Status: AC
  Administered 2017-07-26: 155 [IU] via INTRAMUSCULAR

## 2017-07-26 NOTE — Progress Notes (Signed)
[facility]   Procedure Note Botox  Attending: Dr. Shon MilletAdam Wells Gerdeman  Preoperative Diagnosis(es): Chronic migraine  Consent obtained from: The patient Benefits discussed included, but were not limited to decreased muscle tightness, increased joint range of motion, and decreased pain.  Risk discussed included, but were not limited pain and discomfort, bleeding, bruising, excessive weakness, venous thrombosis, muscle atrophy and dysphagia.  Anticipated outcomes of the procedure as well as he risks and benefits of the alternatives to the procedure, and the roles and tasks of the personnel to be involved, were discussed with the patient, and the patient consents to the procedure and agrees to proceed. A copy of the patient medication guide was given to the patient which explains the blackbox warning.  Patients identity and treatment sites confirmed Yes.  .  Details of Procedure: Skin was cleaned with alcohol. Prior to injection, the needle plunger was aspirated to make sure the needle was not within a blood vessel.  There was no blood retrieved on aspiration.    Following is a summary of the muscles injected  And the amount of Botulinum toxin used:  Dilution 200 units of Botox was reconstituted with 4 ml of preservative free normal saline. Time of reconstitution: At the time of the office visit (<30 minutes prior to injection)   Injections  155 total units of Botox was injected with a 30 gauge needle.  Injection Sites: L occipitalis: 15 units- 3 sites  R occiptalis: 15 units- 3 sites  L upper trapezius: 15 units- 3 sites R upper trapezius: 15 units- 3 sits          L paraspinal: 10 units- 2 sites R paraspinal: 10 units- 2 sites  Face L frontalis(2 injection sites):10 units   R frontalis(2 injection sites):10 units         L corrugator: 5 units   R corrugator: 5 units           Procerus: 5 units   L temporalis: 20 units R temporalis: 20 units   Agent:  200 units of botulinum Type  A (Onobotulinum Toxin type A) was reconstituted with 4 ml of preservative free normal saline.  Time of reconstitution: At the time of the office visit (<30 minutes prior to injection)     Total injected (Units): 155  Total wasted (Units): 0  Patient tolerated procedure well without complications.   Reinjection is anticipated in 3 months. Return to clinic in 6 weeks.

## 2017-08-26 ENCOUNTER — Encounter: Payer: Self-pay | Admitting: Neurology

## 2017-08-26 ENCOUNTER — Ambulatory Visit: Payer: BC Managed Care – PPO | Admitting: Neurology

## 2017-08-26 VITALS — BP 110/72 | HR 74 | Ht 65.0 in | Wt 180.8 lb

## 2017-08-26 DIAGNOSIS — G43009 Migraine without aura, not intractable, without status migrainosus: Secondary | ICD-10-CM

## 2017-08-26 MED ORDER — ELETRIPTAN HYDROBROMIDE 40 MG PO TABS: ORAL_TABLET | ORAL | 2 refills | Status: DC

## 2017-08-26 MED ORDER — PROPRANOLOL HCL ER 60 MG PO CP24: 60.0000 mg | ORAL_CAPSULE | Freq: Every day | ORAL | 2 refills | Status: DC

## 2017-08-26 NOTE — Patient Instructions (Signed)
Migraine Recommendations: 1.  Stop Botox.  Start propranolol ER 60mg  daily.  Call in 4 weeks with update and we can adjust dose if needed.  Monitor for lightheadedness. 2.  Stop sumatriptan.  Instead, take eletriptan 40mg  at earliest onset of headache.  May repeat dose once in 2 hours if needed.  Do not exceed two tablets in 24 hours. 3.  Limit use of pain relievers to no more than 2 days out of the week.  These medications include acetaminophen, ibuprofen, triptans and narcotics.  This will help reduce risk of rebound headaches. 4.  Be aware of common food triggers such as processed sweets, processed foods with nitrites (such as deli meat, hot dogs, sausages), foods with MSG, alcohol (such as wine), chocolate, certain cheeses, certain fruits (dried fruits, bananas, some citrus fruit), vinegar, diet soda. 4.  Avoid caffeine 5.  Routine exercise 6.  Proper sleep hygiene 7.  Stay adequately hydrated with water 8.  Keep a headache diary. 9.  Maintain proper stress management. 10.  Do not skip meals. 11.  Consider supplements:  Magnesium citrate 400mg  to 600mg  daily, riboflavin 400mg , Coenzyme Q 10 100mg  three times daily

## 2017-08-26 NOTE — Progress Notes (Signed)
NEUROLOGY FOLLOW UP OFFICE NOTE  Sierra Pittman [id number]  HISTORY OF PRESENT ILLNESS: Sierra Pittman is a 47 year old female who follows up for migraine.  UPDATE: Headaches have not had any significant improvement since initiating Botox. Intensity:  5/10 (10/10 severe) Duration:  4 hours to several hours Frequency:  10 days in one month Frequency of pain relievers:  infrequent Current NSAIDS:  Ibuprofen (rarely for body aches) Current analgesics:  no Current triptans:  sumatriptan 50mg -100mg  Current anti-emetic:  no Current muscle relaxants:  no Current anti-anxiolytic:  no Current sleep aide:  no Current Antihypertensive medications:  no Current Antidepressant medications:  no Current Anticonvulsant medications:  no Current Vitamins/Herbal/Supplements:  no Current Antihistamines/Decongestants:  no Other therapy:  Botox (status post 2 rounds)   Caffeine:  3 diet [employer] daily Alcohol:  rarely Smoker:  no Diet:  Hydrates but needs to improve.  Watches diet. Exercise:  yes Depression/anxiety:  Some anxiety and mild depression Sleep hygiene:  Wakes up in night and cannot fall back asleep   HISTORY: Onset:  High school Location:  Either from right trochlear region radiating to the right side or top of head radiating holocephalic Quality:  pounding Initial Intensity:  Usually 5/10, severe 10/10 Aura:  no Prodrome:  no Postdrome:  no Associated symptoms:  Nausea, photophobia, phonophobia, osmophobia.  Severe migraines associated with vomiting..  She has not had any new worse headache of her life, waking up from sleep Initial Duration:  Usually 12 hours untreated (less than 4 hours treated); severe migraines last 3 days Initial Frequency:  4 to 5 days a week; severe migraines occur every few months Initial Frequency of abortive medication: 4-5 days a week Triggers/exacerbating factors:  Smells (disinfectants, perfumes), foods (tree nuts, sausage/bacon), during  pregnancy Relieving factors:  Laying down to rest. Activity:  Aggravates.  Cannot function at all if severe.   Past NSAIDS:  Excedrin Migraine Past analgesics:  Tylenol, Excedrin Migraine Past abortive triptans:  no Past muscle relaxants:  no Past anti-emetic:  Zofran (effective) Past antihypertensive medications:  Metoprolol 50mg  (took briefly while weaning off venlafaxine- stopped because she wasn't feeling well, but it could have been withdrawal effect from venlafaxine) Past antidepressant medications:  venlafaxine XR 150mg  (no longer effective, weight gain) Past anticonvulsant medications:  topiramate (cognitive changes, no longer effective) Past vitamins/Herbal/Supplements:  no Other past therapies:  no   Family history of headache:  Mother, maternal grandmother, sister  PAST MEDICAL HISTORY: Past Medical History:  Diagnosis Date  . Allergy   . Anxiety   . Depression   . Gestational diabetes   . Hypertension     MEDICATIONS: Current Outpatient Medications on File Prior to Visit  Medication Sig Dispense Refill  . ondansetron (ZOFRAN ODT) 4 MG disintegrating tablet Take 1 tablet (4 mg total) by mouth every 8 (eight) hours as needed for nausea or vomiting. 20 tablet 2  . Triamcinolone Acetonide (NASACORT ALLERGY 24HR NA) Place into the nose.    . metoprolol succinate (TOPROL-XL) 50 MG 24 hr tablet Take 1 tablet (50 mg total) by mouth daily. Take with or immediately following a meal. (Patient not taking: Reported on 03/08/2017) 30 tablet 3   No current facility-administered medications on file prior to visit.     ALLERGIES: Allergies  Allergen Reactions  . Amoxicillin Rash  . Penicillins Rash    FAMILY HISTORY: Family History  Problem Relation Age of Onset  . Hyperlipidemia Mother   . Hypertension Father   . Diabetes  Father   . Hyperlipidemia Father   . Dementia Maternal Grandmother   . Prostate cancer Maternal Grandfather   . Hypertension Paternal Grandmother    . Dementia Paternal Grandfather     SOCIAL HISTORY: Social History   Socioeconomic History  . Marital status: Divorced    Spouse name: Not on file  . Number of children: 2  . Years of education: Not on file  . Highest education level: Not on file  Occupational History  . 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 Financial resource strain: Not on file  . Food insecurity:    Worry: Not on file    Inability: Not on file  . Transportation needs:    Medical: Not on file    Non-medical: Not on file  Tobacco Use  . Smoking status: Former Games developermoker  . Smokeless tobacco: Never Used  Substance and Sexual Activity  . Alcohol use: Yes    Comment: occ  . Drug use: No  . Sexual activity: Not on file  Lifestyle  . Physical activity:    Days per week: Not on file    Minutes per session: Not on file  . Stress: Not on file  Relationships  . Social connections:    Talks on phone: Not on file    Gets together: Not on file    Attends religious service: Not on file    Active member of club or organization: Not on file    Attends meetings of clubs or organizations: Not on file    Relationship status: Not on file  . Intimate partner violence:    Fear of current or ex partner: Not on file    Emotionally abused: Not on file    Physically abused: Not on file    Forced sexual activity: Not on file  Other Topics Concern  . Not on file  Social History Narrative   Divorced, lives with her 2 children in a 1 story house. Has 1 dog. Drinks 3 diet sodas a day. Exercises 3-5x a week at the gym. Has a bachelors degree. 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    REVIEW OF SYSTEMS: Constitutional: No fevers, chills, or sweats, no generalized fatigue, change in appetite Eyes: No visual changes, double vision, eye pain Ear, nose and throat: No hearing loss, ear pain, nasal congestion, sore throat Cardiovascular: No chest pain, palpitations Respiratory:   No shortness of breath at rest or with exertion, wheezes GastrointestinaI: No nausea, vomiting, diarrhea, abdominal pain, fecal incontinence Genitourinary:  No dysuria, urinary retention or frequency Musculoskeletal:  No neck pain, back pain Integumentary: No rash, pruritus, skin lesions Neurological: as above Psychiatric: No depression, insomnia, anxiety Endocrine: No palpitations, fatigue, diaphoresis, mood swings, change in appetite, change in weight, increased thirst Hematologic/Lymphatic:  No purpura, petechiae. Allergic/Immunologic: no itchy/runny eyes, nasal congestion, recent allergic reactions, rashes  PHYSICAL EXAM: Vitals:   08/26/17 0908  BP: 110/72  Pulse: 74  SpO2: 98%   General: No acute distress.  Patient appears well-groomed.   Head:  Normocephalic/atraumatic Eyes:  Fundi examined but not visualized Neck: supple, no paraspinal tenderness, full range of motion Heart:  Regular rate and rhythm Lungs:  Clear to auscultation bilaterally Back: No paraspinal tenderness Neurological Exam: alert and oriented to person, place, and time. Attention span and concentration intact, recent and remote memory intact, fund of knowledge intact.  Speech fluent and not dysarthric, language intact.  CN II-XII intact. Bulk and tone normal, muscle  strength 5/5 throughout.  Sensation to light touch  intact.  Deep tendon reflexes 2+ throughout.  Finger to nose testing intact.  Gait normal, Romberg negative.  IMPRESSION: Migraine without aura, not intractable  PLAN: 1.  Discontinue Botox.  Start propranolol ER 60mg  daily.  Call in 4 weeks with update and we can adjust dose if needed.  Monitor for lightheadedness.  2.  Stop sumatriptan.  Instead, take eletriptan 40mg  at earliest onset of headache.  May repeat dose once in 2 hours if needed.  Do not exceed two tablets in 24 hours. 3.  Limit use of pain relievers to no more than 2 days out of the week.  These medications include acetaminophen,  ibuprofen, triptans and narcotics.  This will help reduce risk of rebound headaches. 4.  Be aware of common food triggers such as processed sweets, processed foods with nitrites (such as deli meat, hot dogs, sausages), foods with MSG, alcohol (such as wine), chocolate, certain cheeses, certain fruits (dried fruits, bananas, some citrus fruit), vinegar, diet soda. 4.  Avoid caffeine 5.  Routine exercise 6.  Proper sleep hygiene 7.  Stay adequately hydrated with water 8.  Keep a headache diary. 9.  Maintain proper stress management. 10.  Do not skip meals. 11.  Consider supplements:  Magnesium citrate 400mg  to 600mg  daily, riboflavin 400mg , Coenzyme Q 10 100mg  three times daily 12.  Follow up in 3 months.  Shon Millet, DO  CC: Dr. Ermalinda Memos

## 2017-08-26 NOTE — Progress Notes (Signed)
Pt is stopping sumatriptan,changing to Relpax (eletriptan) 40 mg. Pt also started propranolol, which is the reason Maxalt (rizatriptan) was not tried instead of Relpax.

## 2017-08-27 NOTE — Progress Notes (Signed)
[facility] (p#[phone] f#[phone], stating needs PA. Called the number provided on fax, [phone] [facility]. She said does not require PA, it is a quantitiy vs time issue due to her filling sumatriptan w/in last 30 days. She transferred me to customer care [phone] [facility]. She was [facility], call disconnected. Called back, was transferred twice, finally spoke with Riki RuskJeremy in correct department, call disconnected mid-sentence. Called back, spoke with Wynona Caneshristine, she transferred me, call was disconnected again. Will try again tomorrow.

## 2017-09-27 ENCOUNTER — Encounter: Payer: Self-pay | Admitting: Neurology

## 2017-11-18 ENCOUNTER — Encounter: Payer: Self-pay | Admitting: Family Medicine

## 2017-11-18 ENCOUNTER — Ambulatory Visit (INDEPENDENT_AMBULATORY_CARE_PROVIDER_SITE_OTHER): Payer: BC Managed Care – PPO | Admitting: Family Medicine

## 2017-11-18 VITALS — BP 105/70 | HR 69 | Temp 99.3°F | Ht 65.0 in | Wt 178.0 lb

## 2017-11-18 DIAGNOSIS — Z0000 Encounter for general adult medical examination without abnormal findings: Secondary | ICD-10-CM | POA: Diagnosis not present

## 2017-11-18 DIAGNOSIS — Z91018 Allergy to other foods: Secondary | ICD-10-CM

## 2017-11-18 NOTE — Progress Notes (Signed)
   HPI  Patient presents today for annual physical exam.  Patient is working for migraine headaches with neurology, she is about to start propranolol, she uses sumatriptan with some good improvement She is also taking some supplements per their recommendations.  She is concerned about food allergies She has noticed when she eats eggs or tree nuts she has 1 to 2 days of persistent diarrhea or vomiting. It resolves after the food substance has cleared her system. She would like to see an allergist.  She is working out regularly, at least 3 hours a week, also watching her diet closely.  She feels good in general  PMH: Smoking status noted ROS: Per HPI  Objective: BP 105/70 (BP Location: Right Arm, Patient Position: Sitting, Cuff Size: Normal)   Pulse 69   Temp 99.3 F (37.4 C) (Oral)   Ht '5\' 5"'$  (1.651 m)   Wt 178 lb (80.7 kg)   BMI 29.62 kg/m  Gen: NAD, alert, cooperative with exam HEENT: NCAT, EOMI, PERRL, oropharynx moist and clear CV: RRR, good S1/S2, no murmur Resp: CTABL, no wheezes, non-labored Abd: SNTND, BS present, no guarding or organomegaly Ext: No edema, warm Neuro: Alert and oriented, No gross deficits  Assessment and plan:  #No physical exam Normal exam, weight but she has made very positive changes.  I encouraged her to continue. Labs today  #Food allergy Suspected food allergy with eggs and tree nuts Refer to allergy, appreciate their recommendations and evaluation   Orders Placed This Encounter  Procedures  . HM PAP SMEAR    This external order was created through the Results Console.  . CMP14+EGFR  . CBC with Differential/Platelet  . Lipid panel  . TSH  . Ambulatory referral to Allergy    Referral Priority:   Routine    Referral Type:   Allergy Testing    Referral Reason:   Specialty Services Required    Requested Specialty:   Allergy    Number of Visits Requested:   [place], MD [facility] 11/18/2017,  10:27 AM

## 2017-11-18 NOTE — Patient Instructions (Signed)
Great to see you!   

## 2017-11-19 LAB — CBC WITH DIFFERENTIAL/PLATELET
BASOS ABS: 0 10*3/uL (ref 0.0–0.2)
Basos: 1 %
EOS (ABSOLUTE): 0.1 10*3/uL (ref 0.0–0.4)
Eos: 2 %
HEMOGLOBIN: 14.4 g/dL (ref 11.1–15.9)
Hematocrit: 41.5 % (ref 34.0–46.6)
Immature Grans (Abs): 0 10*3/uL (ref 0.0–0.1)
Immature Granulocytes: 0 %
LYMPHS ABS: 1.5 10*3/uL (ref 0.7–3.1)
Lymphs: 32 %
MCH: 29.5 pg (ref 26.6–33.0)
MCHC: 34.7 g/dL (ref 31.5–35.7)
MCV: 85 fL (ref 79–97)
MONOCYTES: 6 %
MONOS ABS: 0.3 10*3/uL (ref 0.1–0.9)
NEUTROS ABS: 2.8 10*3/uL (ref 1.4–7.0)
Neutrophils: 59 %
Platelets: 187 10*3/uL (ref 150–450)
RBC: 4.88 x10E6/uL (ref 3.77–5.28)
RDW: 13 % (ref 12.3–15.4)
WBC: 4.7 10*3/uL (ref 3.4–10.8)

## 2017-11-19 LAB — CMP14+EGFR
ALBUMIN: 4.4 g/dL (ref 3.5–5.5)
ALT: 16 IU/L (ref 0–32)
AST: 19 IU/L (ref 0–40)
Albumin/Globulin Ratio: 1.7 (ref 1.2–2.2)
Alkaline Phosphatase: 73 IU/L (ref 39–117)
BUN / CREAT RATIO: 22 (ref 9–23)
BUN: 17 mg/dL (ref 6–24)
Bilirubin Total: 0.6 mg/dL (ref 0.0–1.2)
CO2: 24 mmol/L (ref 20–29)
CREATININE: 0.79 mg/dL (ref 0.57–1.00)
Calcium: 9.5 mg/dL (ref 8.7–10.2)
Chloride: 105 mmol/L (ref 96–106)
GFR calc non Af Amer: 89 mL/min/{1.73_m2} (ref >59)
GFR, EST AFRICAN AMERICAN: 102 mL/min/{1.73_m2} (ref >59)
GLUCOSE: 87 mg/dL (ref 65–99)
Globulin, Total: 2.6 g/dL (ref 1.5–4.5)
Potassium: 4.2 mmol/L (ref 3.5–5.2)
Sodium: 140 mmol/L (ref 134–144)
TOTAL PROTEIN: 7 g/dL (ref 6.0–8.5)

## 2017-11-19 LAB — LIPID PANEL
CHOLESTEROL TOTAL: 217 mg/dL — AB (ref 100–199)
Chol/HDL Ratio: 3.3 ratio (ref 0.0–4.4)
HDL: 65 mg/dL (ref >39)
LDL CALC: 135 mg/dL — AB (ref 0–99)
Triglycerides: 87 mg/dL (ref 0–149)
VLDL Cholesterol Cal: 17 mg/dL (ref 5–40)

## 2017-11-19 LAB — TSH: TSH: 2.1 u[IU]/mL (ref 0.450–4.500)

## 2017-12-02 ENCOUNTER — Ambulatory Visit: Payer: Self-pay | Admitting: Neurology

## 2018-01-07 ENCOUNTER — Ambulatory Visit: Payer: BC Managed Care – PPO | Admitting: Allergy and Immunology

## 2018-01-07 ENCOUNTER — Encounter: Payer: Self-pay | Admitting: Allergy and Immunology

## 2018-01-07 VITALS — BP 130/86 | HR 86 | Temp 98.6°F | Resp 16 | Ht 65.0 in | Wt 178.0 lb

## 2018-01-07 DIAGNOSIS — J3089 Other allergic rhinitis: Secondary | ICD-10-CM

## 2018-01-07 DIAGNOSIS — R1907 Generalized intra-abdominal and pelvic swelling, mass and lump: Secondary | ICD-10-CM | POA: Diagnosis not present

## 2018-01-07 DIAGNOSIS — R1110 Vomiting, unspecified: Secondary | ICD-10-CM | POA: Diagnosis not present

## 2018-01-07 DIAGNOSIS — T783XXA Angioneurotic edema, initial encounter: Secondary | ICD-10-CM | POA: Diagnosis not present

## 2018-01-07 NOTE — Patient Instructions (Addendum)
  1.  Allergen avoidance measures?  2.  Try a preventative plan of the following:   A.  Loratadine 10 mg -2 tablets once a day  B.  Ranitidine 150 mg -2 tablets once a day  3.  Investigation:    A. alpha gal panel, C4, tryptase, nut panel with reflex, egg panel with reflex  4.  Further investigation?  Yes if recurrent reactions  5.  Return to clinic in 12 weeks or earlier if problem

## 2018-01-07 NOTE — Progress Notes (Signed)
Dear Dr. Ermalinda MemosBradshaw,  Thank you for referring Sierra Pittman to the [facility] on 01/07/2018.   Below is a summation of this patient's evaluation and recommendations.  Thank you for your referral. I will keep you informed about this patient's response to treatment.   If you have any questions please do not hesitate to contact me.   Sincerely,  Jessica PriestEric J. Tinnie Kunin, MD Allergy / Immunology [facility]   ______________________________________________________________________    NEW PATIENT NOTE  Referring Provider: Elenora GammaBradshaw, Samuel L, MD Primary Provider: Elenora GammaBradshaw, Samuel L, MD Date of office visit: 01/07/2018    Subjective:   Chief Complaint:  Sierra Pittman (DOB: 12/19/1970) is a 47 y.o. female who presents to the clinic on 01/07/2018 with a chief complaint of Food Intolerance .     HPI: Sierra Pittman presents to this clinic in evaluation of 2 main issues.  First, she has a multiyear history of having intermittent explosive vomiting and diarrhea with a frequency of approximately 1 time per month although this past February and March this appeared to occur every week.  Usually she would have this explosive vomiting and diarrhea that would last an hour or so and then she would have burping that would go on for a day or 2.  She is not exactly sure what the trigger is for this issue.  She thinks that egg consumption may be precipitating these events and she thinks that tree nut consumption especially pecan can trigger this issue.  There appears to be a multi hour delay though regarding consumption of these foods and the development of these issues and sometimes the delay can be 12 hours.  But there are multiple episodes that do not have any obvious trigger.  She did have investigation with a gastroenterologist several years ago and had an upper endoscopy and apparently she had Helicobacter pylori and was treated  appropriately for that condition.  There is no associated systemic or constitutional symptoms.  Specifically, she does not develop any hives or lip swelling or tongue swelling or respiratory tract symptoms with these episodes.  Second, she does have a history of very mild nasal congestion and sneezing on an intermittent basis may be during the spring and fall that does not require any specific therapy.  Past Medical History:  Diagnosis Date  . Allergy   . Anxiety   . Depression   . Gestational diabetes   . Hypertension     History reviewed. No pertinent surgical history.  Allergies as of 01/07/2018      Reactions   Amoxicillin Rash   Penicillins Rash      Medication List      Co Q 10 100 MG Caps Take by mouth.   eletriptan 40 MG tablet Commonly known as:  RELPAX Take 1 tablet earliest onset of migraine.  May repeat once in 2 hours if headache persists or recurs.   Magnesium 400 MG Caps Take 400 mg by mouth daily.   metoprolol succinate 50 MG 24 hr tablet Commonly known as:  TOPROL-XL Take 1 tablet (50 mg total) by mouth daily. Take with or immediately following a meal.   NASACORT ALLERGY 24HR NA Place into the nose.   ondansetron 4 MG disintegrating tablet Commonly known as:  ZOFRAN ODT Take 1 tablet (4 mg total) by mouth every 8 (eight) hours as needed for nausea or vomiting.   propranolol ER 60 MG 24 hr  capsule Commonly known as:  INDERAL LA Take 1 capsule (60 mg total) by mouth daily.   riboflavin 100 MG Tabs tablet Commonly known as:  VITAMIN B-2 Take 400 mg by mouth daily.   SUMAtriptan 100 MG tablet Commonly known as:  IMITREX Take 100 mg by mouth once as needed.       Review of systems negative except as noted in HPI / PMHx or noted below:  Review of Systems  Constitutional: Negative.   HENT: Negative.   Eyes: Negative.   Respiratory: Negative.   Cardiovascular: Negative.   Gastrointestinal: Negative.   Genitourinary: Negative.     Musculoskeletal: Negative.   Skin: Negative.   Neurological: Negative.   Endo/Heme/Allergies: Negative.   Psychiatric/Behavioral: Negative.     Family History  Problem Relation Age of Onset  . Hyperlipidemia Mother   . Hypertension Father   . Diabetes Father   . Hyperlipidemia Father   . Dementia Maternal Grandmother   . Prostate cancer Maternal Grandfather   . Hypertension Paternal Grandmother   . Dementia Paternal Grandfather   . Hyperlipidemia Brother   . Hyperlipidemia Brother   . Hyperlipidemia Brother     Social History   Socioeconomic History  . Marital status: Divorced    Spouse name: Not on file  . Number of children: 2  . Years of education: Not on file  . Highest education level: Not on file  Occupational History  . 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 Financial resource strain: Not on file  . Food insecurity:    Worry: Not on file    Inability: Not on file  . Transportation needs:    Medical: Not on file    Non-medical: Not on file  Tobacco Use  . Smoking status: Former Smoker    Last attempt to quit: 11/18/2000    Years since quitting: 17.1  . Smokeless tobacco: Never Used  Substance and Sexual Activity  . Alcohol use: Yes    Comment: occ  . Drug use: No  . Sexual activity: Yes    Birth control/protection: Other-see comments    Comment: hasn't had a period in over a year  Lifestyle  . Physical activity:    Days per week: Not on file    Minutes per session: Not on file  . Stress: Not on file  Relationships  . Social connections:    Talks on phone: Not on file    Gets together: Not on file    Attends religious service: Not on file    Active member of club or organization: Not on file    Attends meetings of clubs or organizations: Not on file    Relationship status: Not on file  . Intimate partner violence:    Fear of current or ex partner: Not on file    Emotionally abused: Not on file     Physically abused: Not on file    Forced sexual activity: Not on file  Other Topics Concern  . Not on file  Social History Narrative   Divorced, lives with her 2 children in a 1 story house. Has 1 dog. Drinks 3 diet sodas a day. Exercises 3-5x a week at the gym. Has a bachelors degree. 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    Environmental and Social history  Lives in a house with a dry environment, a dog located inside the household, no carpet in the bedroom, no plastic on the bed, no  plastic on the pillow, and no smokers located inside the household.  She is a Manufacturing systems engineer.  Objective:   Vitals:   01/07/18 0911  BP: 130/86  Pulse: 86  Resp: 16  Temp: 98.6 F (37 C)  SpO2: 99%   Height: 5\' 5"  (165.1 cm) Weight: 178 lb (80.7 kg)  Physical Exam  HENT:  Head: Normocephalic.  Right Ear: Tympanic membrane, external ear and ear canal normal.  Left Ear: Tympanic membrane, external ear and ear canal normal.  Nose: Nose normal. No mucosal edema or rhinorrhea.  Mouth/Throat: Uvula is midline, oropharynx is clear and moist and mucous membranes are normal. No oropharyngeal exudate.  Eyes: Conjunctivae are normal.  Neck: Trachea normal. No tracheal tenderness present. No tracheal deviation present. No thyromegaly present.  Cardiovascular: Normal rate, regular rhythm, S1 normal, S2 normal and normal heart sounds.  No murmur heard. Pulmonary/Chest: Breath sounds normal. No stridor. No respiratory distress. She has no wheezes. She has no rales.  Musculoskeletal: She exhibits no edema.  Lymphadenopathy:       Head (right side): No tonsillar adenopathy present.       Head (left side): No tonsillar adenopathy present.    She has no cervical adenopathy.  Neurological: She is alert.  Skin: No rash noted. She is not diaphoretic. No erythema. Nails show no clubbing.    Diagnostics: Allergy skin tests were performed.  She did not demonstrate any hypersensitivity against a screening  panel of aeroallergens or foods.  Assessment and Plan:    1. Abdominal swelling, generalized   2. Angioedema, initial encounter   3. Recurrent vomiting   4. Other allergic rhinitis     1.  Allergen avoidance measures?  2.  Try a preventative plan of the following:   A.  Loratadine 10 mg -2 tablets once a day  B.  Ranitidine 150 mg -2 tablets once a day  3.  Investigation:    A. alpha gal panel, C4, tryptase, nut panel with reflex, egg panel with reflex  4.  Further investigation?  Yes if recurrent reactions  5.  Return to clinic in 12 weeks or earlier if problem  Although Dorianna does have a history consistent with some degree of atopic disease involving her upper airway and conjunctiva there does not appear to be an atopic trigger for her GI issue.  I have ordered some blood tests in investigation of other etiologic factors that can contribute to her GI complaints and also to work through in a little bit more detail whether or not atopic disease is contributing to this issue.  She can try a combination of an H1 and H2 receptor blocker to see if this helps her issue.  She will keep in contact with me noting her response to this approach as she moves forward and I will regroup with her in 12 weeks or earlier if there is a problem.  Jessica Priest, MD Allergy / Immunology [facility] [place]

## 2018-01-08 ENCOUNTER — Encounter: Payer: Self-pay | Admitting: Allergy and Immunology

## 2018-01-10 LAB — ALPHA-GAL PANEL
BEEF CLASS INTERPRETATION: 0
Beef (Bos spp) IgE: <0.1 kU/L (ref <0.35)
LAMB CLASS INTERPRETATION: 0
PORK CLASS INTERPRETATION: 0

## 2018-01-10 LAB — IGE NUT PROF. W/COMPONENT RFLX
F020-IgE Almond: <0.1 kU/L
F203-IgE Pistachio Nut: <0.1 kU/L
Hazelnut (Filbert) IgE: <0.1 kU/L
Macadamia Nut, IgE: <0.1 kU/L
Peanut, IgE: <0.1 kU/L

## 2018-01-10 LAB — IGE EGG WHITE W/COMPONENT RFLX: Egg White IgE: <0.1 kU/L

## 2018-01-10 LAB — C4 COMPLEMENT: Complement C4, Serum: 25 mg/dL (ref 14–44)

## 2018-01-15 LAB — TRYPTASE: Tryptase: 3.9 ug/L (ref 2.2–13.2)

## 2018-01-15 LAB — SPECIMEN STATUS REPORT

## 2018-04-01 ENCOUNTER — Ambulatory Visit: Payer: BC Managed Care – PPO | Admitting: Allergy and Immunology

## 2018-06-24 ENCOUNTER — Ambulatory Visit: Payer: BC Managed Care – PPO | Admitting: Family Medicine

## 2018-06-24 VITALS — BP 131/92 | HR 68 | Temp 97.8°F | Ht 65.0 in | Wt 178.0 lb

## 2018-06-24 DIAGNOSIS — G8929 Other chronic pain: Secondary | ICD-10-CM | POA: Diagnosis not present

## 2018-06-24 DIAGNOSIS — R030 Elevated blood-pressure reading, without diagnosis of hypertension: Secondary | ICD-10-CM | POA: Diagnosis not present

## 2018-06-24 DIAGNOSIS — B070 Plantar wart: Secondary | ICD-10-CM | POA: Diagnosis not present

## 2018-06-24 DIAGNOSIS — M25561 Pain in right knee: Secondary | ICD-10-CM | POA: Diagnosis not present

## 2018-06-24 MED ORDER — DICLOFENAC SODIUM 1 % TD GEL: 4.0000 g | Freq: Four times a day (QID) | TRANSDERMAL | 1 refills | Status: DC

## 2018-06-24 NOTE — Patient Instructions (Signed)
Use the duct tape method for the plantar wart.  If you change your mind about freezing it, call the office and we can do that here.  For the knee, this is likely meniscus.  I have included more information.  I am sending the voltaren gel that you can use daily.  If symptoms worsen, we can refer you to the specialist.  I am optimistic about you improving without additional intervention.   Meniscus Tear  A meniscus tear is a knee injury that happens when a piece of the meniscus is torn. The meniscus is a thick, rubbery, wedge-shaped cartilage in the knee. Two menisci are located in each knee. They sit between the upper bone (femur) and lower bone (tibia) that make up the knee joint. Each meniscus acts as a shock absorber for the knee. A torn meniscus is one of the most common types of knee injuries. This injury can range from mild to severe. Surgery may be needed to repair a severe tear. What are the causes? This condition may be caused by any kneeling, squatting, twisting, or pivoting movement. Sports-related injuries are the most common cause. These often occur from:  Running and stopping suddenly. ? Changing direction. ? Being tackled or knocked off your feet.  Lifting or carrying heavy weights. As people get older, their menisci get thinner and weaker. In these people, tears can happen more easily, such as from climbing stairs. What increases the risk? You are more likely to develop this condition if you:  Play contact sports.  Have a job that requires kneeling or squatting.  Are female.  Are over 43 years old. What are the signs or symptoms? Symptoms of this condition include:  Knee pain, especially at the side of the knee joint. You may feel pain when the injury occurs, or you may only hear a pop and feel pain later.  A feeling that your knee is clicking, catching, locking, or giving way.  Not being able to fully bend or extend your knee.  Bruising or swelling in your  knee. How is this diagnosed? This condition may be diagnosed based on your symptoms and a physical exam. You may also have tests, such as:  X-rays.  MRI.  A procedure to look inside your knee with a narrow surgical telescope (arthroscopy). You may be referred to a knee specialist (orthopedic surgeon). How is this treated? Treatment for this injury depends on the severity of the tear. Treatment for a mild tear may include:  Rest.  Medicine to reduce pain and swelling. This is usually a nonsteroidal anti-inflammatory drug (NSAID), like ibuprofen.  A knee brace, sleeve, or wrap.  Using crutches or a walker to keep weight off your knee and to help you walk.  Exercises to strengthen your knee (physical therapy). You may need surgery if you have a severe tear or if other treatments are not working. Follow these instructions at home: If you have a brace, sleeve, or wrap:  Wear it as told by your health care provider. Remove it only as told by your health care provider.  Loosen the brace, sleeve, or wrap if your toes tingle, become numb, or turn cold and blue.  Keep the brace, sleeve, or wrap clean and dry.  If the brace, sleeve, or wrap is not waterproof: ? Do not let it get wet. ? Cover it with a watertight covering when you take a bath or shower. Managing pain and swelling   Take over-the-counter and prescription medicines only as  told by your health care provider.  If directed, put ice on your knee: ? If you have a removable brace, sleeve, or wrap, remove it as told by your health care provider. ? Put ice in a plastic bag. ? Place a towel between your skin and the bag. ? Leave the ice on for 20 minutes, 2-3 times per day.  Move your toes often to avoid stiffness and to lessen swelling.  Raise (elevate) the injured area above the level of your heart while you are sitting or lying down. Activity  Do not use the injured limb to support your body weight until your health  care provider says that you can. Use crutches or a walker as told by your health care provider.  Return to your normal activities as told by your health care provider. Ask your health care provider what activities are safe for you.  Perform range-of-motion exercises only as told by your health care provider.  Begin doing exercises to strengthen your knee and leg muscles only as told by your health care provider. After you recover, your health care provider may recommend these exercises to help prevent another injury. General instructions  Use a knee brace, sleeve, or wrap as told by your health care provider.  Ask your health care provider when it is safe to drive if you have a brace, sleeve, or wrap on your knee.  Do not use any products that contain nicotine or tobacco, such as cigarettes, e-cigarettes, and chewing tobacco. If you need help quitting, ask your health care provider.  Ask your health care provider if the medicine prescribed to you: ? Requires you to avoid driving or using heavy machinery. ? Can cause constipation. You may need to take these actions to prevent or treat constipation:  Drink enough fluid to keep your urine pale yellow.  Take over-the-counter or prescription medicines.  Eat foods that are high in fiber, such as beans, whole grains, and fresh fruits and vegetables.  Limit foods that are high in fat and processed sugars, such as fried or sweet foods.  Keep all follow-up visits as told by your health care provider. This is important. Contact a health care provider if:  You have a fever.  Your knee becomes red, tender, or swollen.  Your pain medicine is not helping.  Your symptoms get worse or do not improve after 2 weeks of home care. Summary  A meniscus tear is a knee injury that happens when a piece of the meniscus is torn.  Treatment for this injury depends on the severity of the tear. You may need surgery if you have a severe tear or if other  treatments are not working.  Rest, ice, and raise (elevate) your injured knee as told by your health care provider. This will help lessen pain and swelling.  Contact a health care provider if you have new symptoms, or your symptoms get worse or do not improve after 2 weeks of home care.  Keep all follow-up visits as told by your health care provider. This is important. This information is not intended to replace advice given to you by your health care provider. Make sure you discuss any questions you have with your health care provider. Document Released: 08/11/2002 Document Revised: 12/03/2017 Document Reviewed: 12/03/2017 Elsevier Interactive Patient Education  2019 [employer].

## 2018-06-24 NOTE — Assessment & Plan Note (Signed)
Likely related to active migraine headache and right knee pain.  She has reported normotensive blood pressures at home.  We will follow-up on this at her next visit.  If persistently elevated, we will consider initiation of antihypertensives.

## 2018-06-24 NOTE — Progress Notes (Signed)
Subjective: CC: Foot pain PCP: Raliegh Ip, DO PVV:ZSMOL Wiltse is a 48 y.o. female presenting to clinic today for:  1.  Foot pain Patient reports onset of right sided foot pain on the plantar aspect of the foot at the end of last summer.  She notes that symptoms have gradually gotten worse and seem to be worse with ambulation or pressure on that foot.  She notes an associated corn or wart on the area of concern.  She has tried over-the-counter wart treatments but she found that these have not resolved her symptoms.  She notes that she has had associated gait change and subsequent increased right knee pain which is been a chronic issue for her.  She reports pain with getting up from a seated position or going up and down steps.  Denies any significant locking or swelling.  She occasionally takes ibuprofen but uses this very sparingly as she has had issues with gastritis in the past related to NSAID use.  Denies any sensation changes but does note occasional sensation of instability.  She is not currently using a brace.  She is physically active and works on the elliptical regularly.  She was a former runner.   ROS: Per HPI  Allergies  Allergen Reactions  . Amoxicillin Rash  . Penicillins Rash   Past Medical History:  Diagnosis Date  . Allergy   . Anxiety   . Depression   . Gestational diabetes   . Hypertension     Current Outpatient Medications:  .  loratadine (CLARITIN) 10 MG tablet, Take 10 mg by mouth daily., Disp: , Rfl:  .  omeprazole (PRILOSEC) 20 MG capsule, Take 20 mg by mouth daily., Disp: , Rfl:  .  SUMAtriptan (IMITREX) 100 MG tablet, Take 100 mg by mouth once as needed., Disp: , Rfl:  Social History   Socioeconomic History  . Marital status: Divorced    Spouse name: Not on file  . Number of children: 2  . Years of education: Not on file  . Highest education level: Not on file  Occupational History  . 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 Financial resource strain: Not on file  . Food insecurity:    Worry: Not on file    Inability: Not on file  . Transportation needs:    Medical: Not on file    Non-medical: Not on file  Tobacco Use  . Smoking status: Former Smoker    Last attempt to quit: 11/18/2000    Years since quitting: 17.6  . Smokeless tobacco: Never Used  Substance and Sexual Activity  . Alcohol use: Yes    Comment: occ  . Drug use: No  . Sexual activity: Yes    Birth control/protection: Other-see comments    Comment: hasn't had a period in over a year  Lifestyle  . Physical activity:    Days per week: Not on file    Minutes per session: Not on file  . Stress: Not on file  Relationships  . Social connections:    Talks on phone: Not on file    Gets together: Not on file    Attends religious service: Not on file    Active member of club or organization: Not on file    Attends meetings of clubs or organizations: Not on file    Relationship status: Not on file  . Intimate partner violence:    Fear of current or ex  partner: Not on file    Emotionally abused: Not on file    Physically abused: Not on file    Forced sexual activity: Not on file  Other Topics Concern  . Not on file  Social History Narrative   Divorced, lives with her 2 children in a 1 story house. Has 1 dog. Drinks 3 diet sodas a day. Exercises 3-5x a week at the gym. Has a bachelors degree. 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   Family History  Problem Relation Age of Onset  . Hyperlipidemia Mother   . Hypertension Father   . Diabetes Father   . Hyperlipidemia Father   . Dementia Maternal Grandmother   . Prostate cancer Maternal Grandfather   . Hypertension Paternal Grandmother   . Dementia Paternal Grandfather   . Hyperlipidemia Brother   . Hyperlipidemia Brother   . Hyperlipidemia Brother     Objective: Office vital signs reviewed. BP (!) 131/92   Pulse 68   Temp 97.8 F (36.6 C)  (Oral)   Ht 5\' 5"  (1.651 m)   Wt 178 lb (80.7 kg)   BMI 29.62 kg/m   Physical Examination:  General: Awake, alert, well nourished, No acute distress Extremities: warm, well perfused, No edema, cyanosis or clubbing; +2 pulses bilaterally MSK: normal gait and station  Right knee: No gross swelling, discoloration or deformities.  No tenderness to palpation to the patella, patellar tendon or quad tendon.  She has mild tenderness to palpation along the lateral joint line.  No tenderness palpation to the posterior popliteal fossa or palpable mass within the posterior popliteal fossa.  No ligamentous laxity.  She does have mild pain with Thessaly. Neuro: 5/5 LE Strength and light touch sensation grossly intact Skin: 2.5 millimeter plantar wart noted along the right forefoot.  No exudate or bleeding.  No evidence of secondary bacterial infection.  Assessment/ Plan: 48 y.o. female   1. Plantar wart, right foot We discussed options for treatment of plantar wart including cryotherapy versus duct taping method.  She has thus far failed OTC remedies.  She wishes to proceed with duct tape method and a handout was provided on how she can do this over the next couple of months.  We discussed that if symptoms are not improving or if she does changes her mind and desires cryotherapy she will schedule appointment to have this done in office.  2. Chronic pain of right knee Likely worsened by alteration of gait related to plantar wart.  I suspect that she has a meniscal injury.  We discussed the general nature of this.  Home physical therapy exercises were provided.  I have also prescribed her topical Voltaren gel given her history of gastritis with oral NSAIDs.  We also discussed consideration for knee brace for stabilization.  If symptoms persist or worsen, we can consider corticosteroid injection versus referral to physical therapy versus orthopedics.  She will follow-up PRN on this issue.   No orders of the  defined types were placed in this encounter.  Meds ordered this encounter  Medications  . diclofenac sodium (VOLTAREN) 1 % GEL    Sig: Apply 4 g topically 4 (four) times daily. To right knee for pain    Dispense:  200 g    Refill:  1     Alandra Sando Hulen Skains, DO [facility] [phone]

## 2018-09-08 ENCOUNTER — Ambulatory Visit: Payer: BC Managed Care – PPO | Admitting: Family Medicine

## 2019-03-24 ENCOUNTER — Other Ambulatory Visit: Payer: Self-pay | Admitting: Registered"

## 2019-03-24 DIAGNOSIS — Z20822 Contact with and (suspected) exposure to covid-19: Secondary | ICD-10-CM

## 2019-03-26 LAB — NOVEL CORONAVIRUS, NAA: SARS-CoV-2, NAA: DETECTED — AB

## 2019-07-09 ENCOUNTER — Telehealth: Payer: Self-pay | Admitting: Family Medicine

## 2019-07-09 ENCOUNTER — Other Ambulatory Visit: Payer: Self-pay

## 2019-07-10 ENCOUNTER — Ambulatory Visit: Payer: BC Managed Care – PPO | Admitting: Family Medicine

## 2019-07-10 ENCOUNTER — Encounter: Payer: Self-pay | Admitting: Family Medicine

## 2019-07-10 VITALS — BP 119/91 | HR 75 | Temp 98.4°F | Ht 65.0 in | Wt 179.8 lb

## 2019-07-10 DIAGNOSIS — M25561 Pain in right knee: Secondary | ICD-10-CM

## 2019-07-10 DIAGNOSIS — Z114 Encounter for screening for human immunodeficiency virus [HIV]: Secondary | ICD-10-CM

## 2019-07-10 DIAGNOSIS — G8929 Other chronic pain: Secondary | ICD-10-CM

## 2019-07-10 DIAGNOSIS — Z0000 Encounter for general adult medical examination without abnormal findings: Secondary | ICD-10-CM | POA: Diagnosis not present

## 2019-07-10 DIAGNOSIS — R197 Diarrhea, unspecified: Secondary | ICD-10-CM | POA: Diagnosis not present

## 2019-07-10 DIAGNOSIS — B009 Herpesviral infection, unspecified: Secondary | ICD-10-CM

## 2019-07-10 DIAGNOSIS — Z13220 Encounter for screening for lipoid disorders: Secondary | ICD-10-CM

## 2019-07-10 DIAGNOSIS — N63 Unspecified lump in unspecified breast: Secondary | ICD-10-CM

## 2019-07-10 MED ORDER — FAMOTIDINE 20 MG PO TABS: 20.0000 mg | ORAL_TABLET | Freq: Every day | ORAL | 3 refills | Status: DC

## 2019-07-10 NOTE — Patient Instructions (Addendum)
Schedule your mammogram.  The orders are in.  Make sure to tell Dr Loyola Mast about the stomach stuff.  I have changed the Omeprazole to Pepcid.   Preventive Care 41-51 Years Old, Female Preventive care refers to visits with your health care provider and lifestyle choices that can promote health and wellness. This includes:  A yearly physical exam. This may also be called an annual well check.  Regular dental visits and eye exams.  Immunizations.  Screening for certain conditions.  Healthy lifestyle choices, such as eating a healthy diet, getting regular exercise, not using drugs or products that contain nicotine and tobacco, and limiting alcohol use. What can I expect for my preventive care visit? Physical exam Your health care provider will check your:  Height and weight. This may be used to calculate body mass index (BMI), which tells if you are at a healthy weight.  Heart rate and blood pressure.  Skin for abnormal spots. Counseling Your health care provider may ask you questions about your:  Alcohol, tobacco, and drug use.  Emotional well-being.  Home and relationship well-being.  Sexual activity.  Eating habits.  Work and work Statistician.  Method of birth control.  Menstrual cycle.  Pregnancy history. What immunizations do I need?  Influenza (flu) vaccine  This is recommended every year. Tetanus, diphtheria, and pertussis (Tdap) vaccine  You may need a Td booster every 10 years. Varicella (chickenpox) vaccine  You may need this if you have not been vaccinated. Zoster (shingles) vaccine  You may need this after age 44. Measles, mumps, and rubella (MMR) vaccine  You may need at least one dose of MMR if you were born in 1957 or later. You may also need a second dose. Pneumococcal conjugate (PCV13) vaccine  You may need this if you have certain conditions and were not previously vaccinated. Pneumococcal polysaccharide (PPSV23) vaccine  You may need  one or two doses if you smoke cigarettes or if you have certain conditions. Meningococcal conjugate (MenACWY) vaccine  You may need this if you have certain conditions. Hepatitis A vaccine  You may need this if you have certain conditions or if you travel or work in places where you may be exposed to hepatitis A. Hepatitis B vaccine  You may need this if you have certain conditions or if you travel or work in places where you may be exposed to hepatitis B. Haemophilus influenzae type b (Hib) vaccine  You may need this if you have certain conditions. Human papillomavirus (HPV) vaccine  If recommended by your health care provider, you may need three doses over 6 months. You may receive vaccines as individual doses or as more than one vaccine together in one shot (combination vaccines). Talk with your health care provider about the risks and benefits of combination vaccines. What tests do I need? Blood tests  Lipid and cholesterol levels. These may be checked every 5 years, or more frequently if you are over 23 years old.  Hepatitis C test.  Hepatitis B test. Screening  Lung cancer screening. You may have this screening every year starting at age 81 if you have a 30-pack-year history of smoking and currently smoke or have quit within the past 15 years.  Colorectal cancer screening. All adults should have this screening starting at age 42 and continuing until age 56. Your health care provider may recommend screening at age 33 if you are at increased risk. You will have tests every 1-10 years, depending on your results and the  type of screening test.  Diabetes screening. This is done by checking your blood sugar (glucose) after you have not eaten for a while (fasting). You may have this done every 1-3 years.  Mammogram. This may be done every 1-2 years. Talk with your health care provider about when you should start having regular mammograms. This may depend on whether you have a family  history of breast cancer.  BRCA-related cancer screening. This may be done if you have a family history of breast, ovarian, tubal, or peritoneal cancers.  Pelvic exam and Pap test. This may be done every 3 years starting at age 65. Starting at age 32, this may be done every 5 years if you have a Pap test in combination with an HPV test. Other tests  Sexually transmitted disease (STD) testing.  Bone density scan. This is done to screen for osteoporosis. You may have this scan if you are at high risk for osteoporosis. Follow these instructions at home: Eating and drinking  Eat a diet that includes fresh fruits and vegetables, whole grains, lean protein, and low-fat dairy.  Take vitamin and mineral supplements as recommended by your health care provider.  Do not drink alcohol if: ? Your health care provider tells you not to drink. ? You are pregnant, may be pregnant, or are planning to become pregnant.  If you drink alcohol: ? Limit how much you have to 0-1 drink a day. ? Be aware of how much alcohol is in your drink. In the [place] one drink equals one 12 oz bottle of beer (355 mL), one 5 oz glass of wine (148 mL), or one 1 oz glass of hard liquor (44 mL). Lifestyle  Take daily care of your teeth and gums.  Stay active. Exercise for at least 30 minutes on 5 or more days each week.  Do not use any products that contain nicotine or tobacco, such as cigarettes, e-cigarettes, and chewing tobacco. If you need help quitting, ask your health care provider.  If you are sexually active, practice safe sex. Use a condom or other form of birth control (contraception) in order to prevent pregnancy and STIs (sexually transmitted infections).  If told by your health care provider, take low-dose aspirin daily starting at age 57. What's next?  Visit your health care provider once a year for a well check visit.  Ask your health care provider how often you should have your eyes and teeth  checked.  Stay up to date on all vaccines. This information is not intended to replace advice given to you by your health care provider. Make sure you discuss any questions you have with your health care provider. Document Revised: 01/30/2018 Document Reviewed: 01/30/2018 Elsevier Patient Education  2020 [employer].

## 2019-07-10 NOTE — Progress Notes (Signed)
Sierra Pittman is a 49 y.o. female presents to office today for annual physical exam examination.    Concerns today include: 1. Breast lump Patient notes that her boyfriend found a lump in her breast recently.  She does not recall ever having had the lump before.  Denies any overt breast pain, skin changes, nipple inversion or discharge.  No known close family history of breast cancers, prostate cancers or ovarian cancers.  She has not had her mammogram yet.  She cannot remember if she had a breast exam with her GYN wellness visit in December.  She is postmenopausal for greater than 2 years now.  2.  Right knee pain Patient reports ongoing intermittent right knee pain.  This seems most prominent with going up and down stairs or an incline.  She has been using the Voltaren gel intermittently with some relief but no resolution.  She remains physically active.  3.  Diarrhea and vomiting Patient reports several year history of intermittent vomiting and diarrhea.  She was worked up by gastroenterology previously and had an EGD done which showed no significant findings except for a hiatal hernia and some evidence of overuse of NSAIDs.  She is scheduled for colonoscopy on Monday but did not mention this information to her new gastroenterologist because she was told that "the colonoscopy might not be paid for if it is not preventative".  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  She was previously on Claritin and ranitidine but the ranitidine was recalled and she was not sure what to replace it with.  She does report that symptoms have been more aggravated since switching over to the omeprazole and is wondering if she is even on the right medications.  Occupation: Education officer, museum, Marital status: in a relationship since September, Substance use: none Diet: balanced, Exercise: regular Last eye exam: UTD Last colonoscopy:  Scheduled Mon w/ Dr Loyola Mast in [place] Last mammogram: >1 year ago at [facility] Last pap smear: UTD, 05/2019 w/ Dr Barrie Dunker  Past Medical History:  Diagnosis Date  . Allergy   . Anxiety   . Depression   . Gestational diabetes   . Hypertension    Social History   Socioeconomic History  . Marital status: Divorced    Spouse name: Not on file  . Number of children: 2  . Years of education: Not on file  . Highest education level: Not on file  Occupational History  . 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 Smoking status: Former Smoker    Quit date: 11/18/2000    Years since quitting: 18.6  . Smokeless tobacco: Never Used  Substance and Sexual Activity  . Alcohol use: Yes    Comment: occ  . Drug use: No  . Sexual activity: Yes    Birth control/protection: Other-see comments    Comment: hasn't had a period in over a year  Other Topics Concern  . Not on file  Social History Narrative   Divorced, lives with her 2 children in a 1 story house. Has 1 dog. Drinks 3 diet sodas a day. Exercises 3-5x a week at the gym. Has a bachelors degree. 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   Social Determinants of Health   Financial Resource Strain:   . Difficulty of Paying Living Expenses: Not on file  Food Insecurity:   . Worried About Charity fundraiser  in the Last Year: Not on file  . Ran Out of Food in the Last Year: Not on file  Transportation Needs:   . Lack of Transportation (Medical): Not on file  . Lack of Transportation (Non-Medical): Not on file  Physical Activity:   . Days of Exercise per Week: Not on file  . Minutes of Exercise per Session: Not on file  Stress:   . Feeling of Stress : Not on file  Social Connections:   . Frequency of Communication with Friends and Family: Not on file  . Frequency of Social Gatherings with Friends and Family: Not on file  . Attends Religious Services: Not on file  . Active Member of Clubs or  Organizations: Not on file  . Attends Archivist Meetings: Not on file  . Marital Status: Not on file  Intimate Partner Violence:   . Fear of Current or Ex-Partner: Not on file  . Emotionally Abused: Not on file  . Physically Abused: Not on file  . Sexually Abused: Not on file   No past surgical history on file. Family History  Problem Relation Age of Onset  . Hyperlipidemia Mother   . Hypertension Father   . Diabetes Father   . Hyperlipidemia Father   . Dementia Maternal Grandmother   . Prostate cancer Maternal Grandfather   . Hypertension Paternal Grandmother   . Dementia Paternal Grandfather   . Hyperlipidemia Brother   . Hyperlipidemia Brother   . Hyperlipidemia Brother     Current Outpatient Medications:  .  diclofenac sodium (VOLTAREN) 1 % GEL, Apply 4 g topically 4 (four) times daily. To right knee for pain, Disp: 200 g, Rfl: 1 .  loratadine (CLARITIN) 10 MG tablet, Take 10 mg by mouth daily., Disp: , Rfl:  .  omeprazole (PRILOSEC) 20 MG capsule, Take 20 mg by mouth daily., Disp: , Rfl:  .  SUMAtriptan (IMITREX) 100 MG tablet, Take 100 mg by mouth once as needed., Disp: , Rfl:   Allergies  Allergen Reactions  . Amoxicillin Rash  . Penicillins Rash     ROS: Review of Systems Constitutional: negative Eyes: positive for contacts/glasses Ears, nose, mouth, throat, and face: negative Respiratory: negative Cardiovascular: negative Gastrointestinal: positive for diarrhea and nausea Genitourinary:newly diagnosed with HPV2. on suppresion therapy w/ Valtrex Integument/breast: positive for breast lump Hematologic/lymphatic: negative Musculoskeletal:positive for arthralgias Neurological: negative Behavioral/Psych: negative Endocrine: negative Allergic/Immunologic: positive for histamine allergy    Physical exam BP (!) 119/91   Pulse 75   Temp 98.4 F (36.9 C)   Ht '5\' 5"'  (1.651 m)   Wt 179 lb 12.8 oz (81.6 kg)   LMP  (LMP Unknown)   SpO2 98%   BMI  29.92 kg/m  General appearance: alert, cooperative, appears stated age and no distress Head: Normocephalic, without obvious abnormality, atraumatic Eyes: negative findings: lids and lashes normal, conjunctivae and sclerae normal and corneas clear Ears: normal external ear Nose: no discharge Throat: lips, mucosa, and tongue normal; teeth and gums normal Neck: no adenopathy, supple, symmetrical, trachea midline and thyroid not enlarged, symmetric, no tenderness/mass/nodules Back: symmetric, no curvature. ROM normal. No CVA tenderness. Lungs: clear to auscultation bilaterally Breasts: No nipple retraction or dimpling, No nipple discharge or bleeding, No axillary or supraclavicular adenopathy, She has a small, rubbery, kidney bean sized mass noted along the inferior aspect of the left breast at approximately 6 o'clock position.  This is not fixed and is easily mobile. Heart: regular rate and rhythm, S1, S2 normal, no  murmur, click, rub or gallop Abdomen: soft, non-tender; bowel sounds normal; no masses,  no organomegaly Extremities: extremities normal, atraumatic, no cyanosis or edema Pulses: 2+ and symmetric Skin: Skin color, texture, turgor normal. No rashes or lesions Lymph nodes: Cervical, supraclavicular, and axillary nodes normal. Neurologic: Grossly normal  MSK: She has full active range of motion of the right knee.  No joint effusion, erythema or soft tissue swelling appreciated.  She does have prominent tibial tuberosities. Psych: Mood stable, speech normal, affect appropriate, pleasant and interactive Depression screen [facility] 2/9 07/10/2019 06/24/2018 11/18/2017  Decreased Interest 0 0 -  Down, Depressed, Hopeless 0 0 0  PHQ - 2 Score 0 0 0  Altered sleeping 0 0 -  Tired, decreased energy 0 0 -  Change in appetite 0 0 -  Feeling bad or failure about yourself  0 0 -  Trouble concentrating 0 0 -  Moving slowly or fidgety/restless 0 0 -  Suicidal thoughts 0 0 -  PHQ-9 Score 0 0 -    Difficult doing work/chores - Not difficult at all -    Assessment/ Plan: Sierra Pittman here for annual physical exam.   1. Annual physical exam  2. Breast lump in female Likely fibroadenoma but will send for diagnostic mammogram with breast ultrasound. 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 Diarrhea, unspecified type Uncertain etiology.  I did reinforce that she should discuss this with her gastroenterologist prior to undergoing colonoscopy so that there is a lower threshold for biopsy if any unusual findings are seen.  She was good understanding and will discuss this soon as possible - CMP14+EGFR - TSH  5. Chronic pain of right knee We will place referral to sports medicine for consideration of ultrasound and evaluation of gait.  May benefit from shoe inserts - Ambulatory referral to Sports Medicine  6. Screening cholesterol level - Lipid Panel  7. Screening for HIV (human immunodeficiency virus) - HIV antibody (with reflex)  Handout on healthy lifestyle choices, including diet (rich in fruits, vegetables and lean meats and low in salt and simple carbohydrates) and exercise (at least 30 minutes of moderate physical activity daily).  Patient to follow up in 1 year for annual exam or sooner if needed.  Shauntee Karp M. Lajuana Ripple, DO

## 2019-07-11 LAB — CMP14+EGFR
ALT: 20 IU/L (ref 0–32)
AST: 24 IU/L (ref 0–40)
Albumin/Globulin Ratio: 1.5 (ref 1.2–2.2)
Albumin: 4.4 g/dL (ref 3.8–4.8)
Alkaline Phosphatase: 84 IU/L (ref 39–117)
BUN/Creatinine Ratio: 13 (ref 9–23)
BUN: 10 mg/dL (ref 6–24)
Bilirubin Total: 0.5 mg/dL (ref 0.0–1.2)
CO2: 23 mmol/L (ref 20–29)
Calcium: 9.5 mg/dL (ref 8.7–10.2)
Chloride: 105 mmol/L (ref 96–106)
Creatinine, Ser: 0.79 mg/dL (ref 0.57–1.00)
GFR calc Af Amer: 101 mL/min/{1.73_m2} (ref >59)
GFR calc non Af Amer: 88 mL/min/{1.73_m2} (ref >59)
Globulin, Total: 2.9 g/dL (ref 1.5–4.5)
Glucose: 90 mg/dL (ref 65–99)
Potassium: 4 mmol/L (ref 3.5–5.2)
Sodium: 143 mmol/L (ref 134–144)
Total Protein: 7.3 g/dL (ref 6.0–8.5)

## 2019-07-11 LAB — CBC
Hematocrit: 42.9 % (ref 34.0–46.6)
Hemoglobin: 14.3 g/dL (ref 11.1–15.9)
MCH: 29.2 pg (ref 26.6–33.0)
MCHC: 33.3 g/dL (ref 31.5–35.7)
MCV: 88 fL (ref 79–97)
Platelets: 216 10*3/uL (ref 150–450)
RBC: 4.89 x10E6/uL (ref 3.77–5.28)
RDW: 13.1 % (ref 11.7–15.4)
WBC: 6.1 10*3/uL (ref 3.4–10.8)

## 2019-07-11 LAB — LIPID PANEL
Chol/HDL Ratio: 3.5 ratio (ref 0.0–4.4)
Cholesterol, Total: 239 mg/dL — ABNORMAL HIGH (ref 100–199)
HDL: 69 mg/dL (ref >39)
LDL Chol Calc (NIH): 144 mg/dL — ABNORMAL HIGH (ref 0–99)
Triglycerides: 149 mg/dL (ref 0–149)
VLDL Cholesterol Cal: 26 mg/dL (ref 5–40)

## 2019-07-11 LAB — TSH: TSH: 1.28 u[IU]/mL (ref 0.450–4.500)

## 2019-07-11 LAB — HIV ANTIBODY (ROUTINE TESTING W REFLEX): HIV Screen 4th Generation wRfx: NONREACTIVE

## 2019-07-13 ENCOUNTER — Encounter: Payer: Self-pay | Admitting: Gastroenterology

## 2019-07-13 HISTORY — PX: COLONOSCOPY: SHX174

## 2019-07-28 ENCOUNTER — Ambulatory Visit: Payer: BC Managed Care – PPO | Admitting: Sports Medicine

## 2019-08-04 ENCOUNTER — Telehealth: Payer: Self-pay | Admitting: Family Medicine

## 2019-08-04 ENCOUNTER — Other Ambulatory Visit: Payer: Self-pay | Admitting: Family Medicine

## 2019-08-04 DIAGNOSIS — R112 Nausea with vomiting, unspecified: Secondary | ICD-10-CM

## 2019-08-04 NOTE — Telephone Encounter (Signed)
Pt says she recently had a colonoscopy and was advised to contact Dr Reece Agar and see if she could refer pt to see Gastroenterologist because pt is having issues with vomiting, diarrhea, itchy skin, sneezing, runny nose. Pt says she only gets these symptoms when eating regular foods.

## 2019-08-04 NOTE — Telephone Encounter (Signed)
Referral placed.  If she has a preference for where she goes, please let courtney know ASAP.

## 2019-08-04 NOTE — Telephone Encounter (Signed)
Pt aware of referral and prefers [facility]

## 2019-08-05 ENCOUNTER — Encounter: Payer: Self-pay | Admitting: Gastroenterology

## 2019-08-05 NOTE — Telephone Encounter (Signed)
Ref has been sent to [facility].

## 2019-08-09 ENCOUNTER — Ambulatory Visit: Payer: BC Managed Care – PPO | Attending: Internal Medicine

## 2019-08-09 DIAGNOSIS — Z23 Encounter for immunization: Secondary | ICD-10-CM

## 2019-08-09 NOTE — Progress Notes (Signed)
   Covid-19 Vaccination Clinic  Name:  Kennita Pavlovich    MRN: [id number] DOB: August 31, 1970  08/09/2019  Ms. Lessner was observed post Covid-19 immunization for 15 minutes without incident. She was provided with Vaccine Information Sheet and instruction to access the V-Safe system.   Ms. Estelle was instructed to call 911 with any severe reactions post vaccine: Marland Kitchen Difficulty breathing  . Swelling of face and throat  . A fast heartbeat  . A bad rash all over body  . Dizziness and weakness   Immunizations Administered    Name Date Dose VIS Date Route   Pfizer COVID-19 Vaccine 08/09/2019 12:26 PM 0.3 mL 05/15/2019 Intramuscular   Manufacturer: [employer], [employer]   Lot: [id number]   NDC: [address]-0

## 2019-08-17 ENCOUNTER — Encounter: Payer: Self-pay | Admitting: Gastroenterology

## 2019-08-17 ENCOUNTER — Ambulatory Visit: Payer: BC Managed Care – PPO | Admitting: Gastroenterology

## 2019-08-17 ENCOUNTER — Other Ambulatory Visit: Payer: Self-pay

## 2019-08-17 VITALS — BP 114/72 | HR 90 | Temp 97.3°F | Ht 65.0 in | Wt 178.5 lb

## 2019-08-17 DIAGNOSIS — R197 Diarrhea, unspecified: Secondary | ICD-10-CM

## 2019-08-17 DIAGNOSIS — K219 Gastro-esophageal reflux disease without esophagitis: Secondary | ICD-10-CM | POA: Diagnosis not present

## 2019-08-17 DIAGNOSIS — R109 Unspecified abdominal pain: Secondary | ICD-10-CM | POA: Diagnosis not present

## 2019-08-17 DIAGNOSIS — Z860100 Personal history of colon polyps, unspecified: Secondary | ICD-10-CM

## 2019-08-17 DIAGNOSIS — K449 Diaphragmatic hernia without obstruction or gangrene: Secondary | ICD-10-CM

## 2019-08-17 DIAGNOSIS — R112 Nausea with vomiting, unspecified: Secondary | ICD-10-CM | POA: Diagnosis not present

## 2019-08-17 MED ORDER — HYOSCYAMINE SULFATE SL 0.125 MG SL SUBL: 0.1250 mg | SUBLINGUAL_TABLET | Freq: Four times a day (QID) | SUBLINGUAL | 5 refills | Status: DC | PRN

## 2019-08-17 NOTE — Patient Instructions (Addendum)
  Low FODMAP Diet: (Fermentable Oligosaccharides, Disaccharides, Monosaccharides, and Polyols) These are short chain carbohydrates and sugar alcohols that are poorly absorbed by the body, resulting in multiple abdominal symptoms, including changes in bowel habits, abdominal pain/discomfort, bloating, abdominal distension, gas, etc.       We have sent the following medications to your pharmacy for you to pick up at your convenience: Levsin  Return in 6 months  It was a pleasure to see you today!  Vito Cirigliano, D.O.

## 2019-08-17 NOTE — Progress Notes (Signed)
Chief Complaint: Abdominal pain, nausea/vomiting, diarrhea  Referring Provider:     Raliegh Ip, DO   HPI:     Sierra Pittman is a 49 y.o. female referred to the Gastroenterology Clinic for evaluation of diarrhea, abdominal pain, nausea/vomiting.  Sxs present for years, starting approx 20 years ago, occurring intermittently. Started as belching and sourbrash followed by diarrhea, n/v and generalized/lower abdominal pain. Occurred sporadically. Ocurs approx 4-5 hours post prandial. No dysphagia. No f/c, weight loss. Can miss work with sxs. No sxs last 2 weeks with bland diet. Occasional body itching and flushing without rash.   Has kept a detailed diet diary (perhaps worse with eggs, pecans, dairy, seasoning) and trialed multiple dietary mods.   Can occur eating out or home-cooked.   Trialed acid suppression meds with some improvement, but sxs contineud to recur.  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  She has since transitioned ranitidine to Pepcid due to recall.  Sxs more sporadic and frrequent over last 5 years.   Hx of reflux (heartburn, regurgitation, belching, sour brash), now relatively well controlled since starting omeprazole 20 mg/day pre dinner.  EGD 2017 with [facility] for reflux symptoms with small hiatal hernia and duodenitis.  Endoscopic history: -Colonoscopy (07/13/2019, Dr. Aurea Graff, [facility]): 3 hyperplastic polyps.  Repeat in 7-10 years.  No biopsies performed -EGD (09/28/15, Dr. Teena Dunk, [facility]): Duodenal bulb duodenitis, hiatal hernia.  H. pylori negative.  -RUQ [place] (date unknown): Normal per review of prior notes -07/10/2019: Normal CBC, CMP, TSH   Past Medical History:  Diagnosis Date  . Allergy   . Anxiety   . Arthritis   . Colon polyps   . Depression   . Gestational diabetes   . Hypertension   . Pneumonia      Past Surgical  History:  Procedure Laterality Date  . COLONOSCOPY  07/13/2019   Arlyn Dunning. Done in [place]  . ESOPHAGOGASTRODUODENOSCOPY     Around 2016 in [place] as well. had a hiatal henia a lot of acid and possibly h. pylori   Family History  Problem Relation Age of Onset  . Hyperlipidemia Mother   . Hypertension Father   . Diabetes Father   . Hyperlipidemia Father   . Dementia Maternal Grandmother   . Prostate cancer Maternal Grandfather   . Hypertension Paternal Grandmother   . Dementia Paternal Grandfather   . Hyperlipidemia Brother   . Colon polyps Brother   . Hyperlipidemia Brother   . Hyperlipidemia Brother   . Colon cancer Neg Hx   . Esophageal cancer Neg Hx    Social History   Tobacco Use  . Smoking status: Former Smoker    Quit date: 11/18/2000    Years since quitting: 18.7  . Smokeless tobacco: Never Used  Substance Use Topics  . Alcohol use: Yes    Comment: occ  . Drug use: No   Current Outpatient Medications  Medication Sig Dispense Refill  . famotidine (PEPCID) 20 MG tablet Take 1 tablet (20 mg total) by mouth daily. 90 tablet 3  . loratadine (CLARITIN) 10 MG tablet Take 10 mg by mouth daily.    Marland Kitchen omeprazole (PRILOSEC) 20 MG capsule Take 20 mg by mouth daily.    . SUMAtriptan (IMITREX) 100 MG tablet Take 100 mg by mouth as needed.     . valACYclovir (VALTREX) 500 MG  tablet Take 500 mg by mouth daily.     No current facility-administered medications for this visit.   Allergies  Allergen Reactions  . Amoxicillin Rash  . Histamine Nausea And Vomiting  . Penicillins Rash     Review of Systems: All systems reviewed and negative except where noted in HPI.     Physical Exam:    Wt Readings from Last 3 Encounters:  08/17/19 178 lb 8 oz (81 kg)  07/10/19 179 lb 12.8 oz (81.6 kg)  06/24/18 178 lb (80.7 kg)    BP 114/72   Pulse 90   Temp (!) 97.3 F (36.3 C)   Ht 5\' 5"  (1.651 m)   Wt 178 lb 8 oz (81 kg)   LMP  (LMP Unknown)   BMI 29.70 kg/m    Constitutional:  Pleasant, in no acute distress. Psychiatric: Normal mood and affect. Behavior is normal. EENT: Pupils normal.  Conjunctivae are normal. No scleral icterus. Neck supple. No cervical LAD. Cardiovascular: Normal rate, regular rhythm. No edema Pulmonary/chest: Effort normal and breath sounds normal. No wheezing, rales or rhonchi. Abdominal: Soft, nondistended, nontender. Bowel sounds active throughout. There are no masses palpable. No hepatomegaly. Neurological: Alert and oriented to person place and time. Skin: Skin is warm and dry. No rashes noted.   ASSESSMENT AND PLAN;   1) Diarrhea 2) Nausea/Vomiting 3) Generalized/right-sided abdominal pain -Trial low FODMAP diet.  Provided with handout today and detailed explanation -Trial Levsin 0.125 mg to evaluate for some additional GI benefit -Recent colonoscopy with otherwise normal-appearing mucosa.  No biopsies obtained.  Did discuss the possibility of [place], but would not explain the nausea/vomiting.  No plan for repeat colonoscopy at this juncture -Otherwise, symptoms do seem to fit constellation of Histamine Intolerance described as flushing, itching, headache, diarrhea, nausea/vomiting, with antihistamine as mainstay treatment  4) GERD 5) Hiatal hernia -Reflux symptoms generally well controlled on omeprazole -Continue omeprazole and antireflux lifestyle/dietary modifications  6) Histamine intolerance -Diagnosis of histamine intolerance by Allergist -Resume home Claritin and Pepcid as above -Discussed the potential overlap between histamine related disease the GI symptoms  7) History of colon polyps -Colonoscopy 07/2019 at [facility] with 3 small hyperplastic polyps, with recommendation repeat in 7-10 years.  Given good bowel prep and current societal guidelines, 10-year interval would be most appropriate  RTC in 6 months or sooner as needed  I spent 45 minutes of time, including in depth chart review, independent  review of results as outlined above, communicating results with the patient directly, face-to-face time with the patient, coordinating care, ordering studies and medications as appropriate, and documentation.    Lavena Bullion, DO, FACG  08/17/2019, 2:37 PM   Sierra Norlander, DO

## 2019-08-30 ENCOUNTER — Ambulatory Visit: Payer: Self-pay | Attending: Internal Medicine

## 2019-08-30 DIAGNOSIS — Z23 Encounter for immunization: Secondary | ICD-10-CM

## 2019-08-30 NOTE — Progress Notes (Signed)
   Covid-19 Vaccination Clinic  Name:  Sierra Pittman    MRN: [id number] DOB: Nov 30, 1970  08/30/2019  [name] was observed post Covid-19 immunization for 30 minutes based on pre-vaccination screening without incident. She was provided with Vaccine Information Sheet and instruction to access the V-Safe system.   [name] was instructed to call 911 with any severe reactions post vaccine: Marland Kitchen Difficulty breathing  . Swelling of face and throat  . A fast heartbeat  . A bad rash all over body  . Dizziness and weakness   Immunizations Administered    Name Date Dose VIS Date Route   Pfizer COVID-19 Vaccine 08/30/2019 11:03 AM 0.3 mL 05/15/2019 Intramuscular   Manufacturer: [employer], [employer]   Lot: [id number]   NDC: [address]-4

## 2019-09-08 ENCOUNTER — Ambulatory Visit: Payer: BC Managed Care – PPO | Admitting: Sports Medicine

## 2019-09-08 ENCOUNTER — Other Ambulatory Visit: Payer: Self-pay

## 2019-09-08 ENCOUNTER — Ambulatory Visit
Admission: RE | Admit: 2019-09-08 | Discharge: 2019-09-08 | Disposition: A | Discharge status: Home / Self Care | Payer: BC Managed Care – PPO | Source: Ambulatory Visit | Attending: Sports Medicine | Admitting: Sports Medicine

## 2019-09-08 VITALS — BP 124/78 | Ht 65.0 in | Wt 172.0 lb

## 2019-09-08 DIAGNOSIS — M25561 Pain in right knee: Secondary | ICD-10-CM

## 2019-09-08 NOTE — Progress Notes (Signed)
PCP: Janora Norlander, DO  Subjective:   HPI: Patient is a 49 y.o. female here for evaluation of right knee pain.  She has a 3-5 year history of right knee pain that was significant with jogging. Because of this pain and concurrent plantar fasciitis of the right foot she stopped jogging 3 years ago. 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 She denies instability, popping or clicking, trauma or injury in the last 5 years. She does endorse twisting one or both of her knees while running in cross-country at 49 yo, and having done PT at that time.  Past Medical History:  Diagnosis Date  . Allergy   . Anxiety   . Arthritis   . Colon polyps   . Depression   . Gestational diabetes   . Hypertension   . Pneumonia     Current Outpatient Medications on File Prior to Visit  Medication Sig Dispense Refill  . famotidine (PEPCID) 20 MG tablet Take 1 tablet (20 mg total) by mouth daily. 90 tablet 3  . Hyoscyamine Sulfate SL (LEVSIN/SL) 0.125 MG SUBL Place 0.125 mg under the tongue every 6 (six) hours as needed. 30 tablet 5  . loratadine (CLARITIN) 10 MG tablet Take 10 mg by mouth daily.    Marland Kitchen omeprazole (PRILOSEC) 20 MG capsule Take 20 mg by mouth daily.    . SUMAtriptan (IMITREX) 100 MG tablet Take 100 mg by mouth as needed.     . valACYclovir (VALTREX) 500 MG tablet Take 500 mg by mouth daily.     No current facility-administered medications on file prior to visit.    Past Surgical History:  Procedure Laterality Date  . COLONOSCOPY  07/13/2019   Adelina Mings. Done in [place]  . ESOPHAGOGASTRODUODENOSCOPY     Around 2016 in [place] as well. had a hiatal henia a lot of acid and possibly h. pylori    Allergies  Allergen Reactions  . Amoxicillin Rash  . Histamine Nausea And Vomiting  . Penicillins Rash    Social History   Socioeconomic History  . Marital status:  Divorced    Spouse name: Not on file  . Number of children: 2  . Years of education: Not on file  . Highest education level: Not on file  Occupational History  . 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 Smoking status: Former Smoker    Quit date: 11/18/2000    Years since quitting: 18.8  . Smokeless tobacco: Never Used  Substance and Sexual Activity  . Alcohol use: Yes    Comment: occ  . Drug use: No  . Sexual activity: Yes    Birth control/protection: Other-see comments    Comment: hasn't had a period in over a year  Other Topics Concern  . Not on file  Social History Narrative   Divorced, lives with her 2 children in a 1 story house. Has 1 dog. Drinks 3 diet sodas a day. Exercises 3-5x a week at the gym. Has a bachelors degree. 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   Social Determinants of Health   Financial Resource Strain:   . Difficulty of Paying Living Expenses:   Food Insecurity:   . Worried About Charity fundraiser in the Last Year:   . Arboriculturist in the Last Year:   Transportation Needs:   .  Lack of Transportation (Medical):   Marland Kitchen Lack of Transportation (Non-Medical):   Physical Activity:   . Days of Exercise per Week:   . Minutes of Exercise per Session:   Stress:   . Feeling of Stress :   Social Connections:   . Frequency of Communication with Friends and Family:   . Frequency of Social Gatherings with Friends and Family:   . Attends Religious Services:   . Active Member of Clubs or Organizations:   . Attends Banker Meetings:   Marland Kitchen Marital Status:   Intimate Partner Violence:   . Fear of Current or Ex-Partner:   . Emotionally Abused:   Marland Kitchen Physically Abused:   . Sexually Abused:     Family History  Problem Relation Age of Onset  . Hyperlipidemia Mother   . Hypertension Father   . Diabetes Father   . Hyperlipidemia Father   . Dementia Maternal Grandmother   . Prostate cancer Maternal  Grandfather   . Hypertension Paternal Grandmother   . Dementia Paternal Grandfather   . Hyperlipidemia Brother   . Colon polyps Brother   . Hyperlipidemia Brother   . Hyperlipidemia Brother   . Colon cancer Neg Hx   . Esophageal cancer Neg Hx     BP 124/78   Ht 5\' 5"  (1.651 m)   Wt 172 lb (78 kg)   LMP  (LMP Unknown)   BMI 28.62 kg/m   Review of Systems: See HPI above.     Objective:  Physical Exam:  Gen: NAD, comfortable in exam room  Right knee: No effusion, erythema, or ecchymosis. 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  FROM. Strength 5/5 Valgus/Varus stress: Neg Ant drawer/Levers/Lachman: Neg Post Drawer: Neg McMurrays: Neg Thessalys: Pain at anterior knee with internal rotation J sign: Positive   Assessment & Plan:  1. Patellar chondromalacia: She has 5 year history of anterior/lateral knee pain with jogging and squatting. With the location and mechanism of her pain, in addition to positive J sign, she likely has degenerative soft tissue disease of the patella. -- AP, lateral, sunrise XR of right knee -- PT -- F/u in 4 weeks -- Use Voltaren gel twice daily for 2 weeks  , MS4  Patient seen and evaluated with the medical student.  I agree with the above plan of care.  X-rays, including the sunrise view, are unremarkable.  This is likely chondromalacia patella.  Treatment as above with physical therapy and topical Voltaren (patient has been advised not to take oral NSAIDs).  Follow-up in 4 weeks.  If symptoms persist, consider merits of further diagnostic imaging versus cortisone injection.

## 2019-09-08 NOTE — Patient Instructions (Addendum)
You were seen today for your right knee pain and likely have chondromalacia, which is caused by degeneration of soft tissues in the knee.   We have ordered X-rays to be done today, and have also sent a prescription for physical therapy to be done near your home.  Continue to use the Voltaren (Diclofenac) gel twice daily for a week or two.   We will have you follow-up in our office in 4 weeks.

## 2019-10-13 ENCOUNTER — Ambulatory Visit: Payer: BC Managed Care – PPO | Admitting: Sports Medicine

## 2019-11-17 ENCOUNTER — Ambulatory Visit: Payer: BC Managed Care – PPO | Admitting: Sports Medicine

## 2019-11-23 ENCOUNTER — Ambulatory Visit (INDEPENDENT_AMBULATORY_CARE_PROVIDER_SITE_OTHER): Payer: BC Managed Care – PPO | Admitting: Sports Medicine

## 2019-11-23 ENCOUNTER — Other Ambulatory Visit: Payer: Self-pay

## 2019-11-23 VITALS — BP 114/82 | Ht 65.0 in | Wt 166.0 lb

## 2019-11-23 DIAGNOSIS — S93401A Sprain of unspecified ligament of right ankle, initial encounter: Secondary | ICD-10-CM | POA: Diagnosis not present

## 2019-11-23 DIAGNOSIS — M2241 Chondromalacia patellae, right knee: Secondary | ICD-10-CM | POA: Diagnosis not present

## 2019-11-23 NOTE — Progress Notes (Signed)
° ° °  SUBJECTIVE:   CHIEF COMPLAINT / HPI: Right knee and ankle pain  Here for follow-up for right chondromalacia patella as well as new right ankle injury described below  Believes she rolled her right ankle inversely while [place] 2 [place].  To do an urgent care and get some x-rays which are not visible to [place] which she says told her there were no fractures of the ankle, she is not sure if they did x-rays of the foot.  Has not been able to tolerate weightbearing since  PERTINENT  PMH / PSH:   OBJECTIVE:   BP 114/82    Ht 5\' 5"  (1.651 m)    Wt 166 lb (75.3 kg)    LMP  (LMP Unknown)    BMI 27.62 kg/m   General: Alert and pleasant, not in distress Right knee exam: Inspection: No visible bruising or effusion Palpation: No pain to palpation or manipulation of patella ROM: Normal range of motion Strength: Good strength in flexion extension Stability: Negative anterior posterior drawer/no valgus varus laxity Special tests: Not applicable Neurovascular: No distal neurological or vascular deficits noted Right foot/ankle exam: Inspection: Mild asymmetric swelling visible when compared to left ankle, no obvious bruising Palpation: Pain to palpation of ankle and with manipulation of ankle as well as with pressure to replicate weightbearing on the foot in neutral position ROM: Range of motion limited by pain Strength: Unable to assess strength due to limitations of pain Stability: No anterior posterior laxity noted Special tests: Nonapplicable Neurovascular: Good cap refill with no neurological sensation deficits noted   ASSESSMENT/PLAN:   Chondromalacia patellae of right knee Healing appropriately, patient still following officially with PT, function improving with no new complaints  Sprain of right ankle Believes she rolled her right ankle (inversion) while coming [place] some stairs [place] the arena 2 days ago on  Saturday night.  To do an urgent care and get some x-rays which are not visible to Monday which she says told her there were no fractures of the ankle, she is not sure if they did x-rays of the foot  Still with too much pain to bear weight or to allow significant manipulation of the foot.,  Will treat as a significant sprain with brace/crutches/weightbearing as tolerated and range of motion exercises.  Follow-up in 2 weeks with Tylenol over-the-counter for pain control as patient has IBS and does not tolerate NSAIDs     [place], DO [facility]   Patient seen and evaluated with the resident.  I agree with the above plan of care.  X-rays were not available for review but the patient states that she was told that they did not show a fracture.  We will treat her right ankle injury as a right ankle sprain.  Treatment as above and follow-up with me in 2 weeks.  If she is not improving then I will start with getting repeat x-rays.  Call with questions or concerns in the interim.

## 2019-11-23 NOTE — Assessment & Plan Note (Addendum)
Believes she rolled her right ankle (inversion) while coming [place] some [place] 2 days [place] Saturday night.  To do an urgent care and get some x-rays which are not visible to [place] which she says told her there were no fractures of the ankle, she is not sure if they did x-rays of the foot  Still with too much pain to bear weight or to allow significant manipulation of the foot.,  Will treat as a significant sprain with brace/crutches/weightbearing as tolerated and range of motion exercises.  Follow-up in 2 weeks with Tylenol over-the-counter for pain control as patient has IBS and does not tolerate NSAIDs

## 2019-11-23 NOTE — Patient Instructions (Signed)
Today we followed up on your right knee injury which appears to be healing well.  We also talked about your new right ankle injury which we believe is a moderate to severe sprain.  Were going to give you some range of motion exercises to do and a brace which will help stabilize your ankle so that you can bear weight as tolerated along with a pair of crutches.  You should come back to see [place] in 2 weeks, if something gets significantly different or worse prior to then please come back earlier.  Even if you are feeling better please come back so that we can make sure that you have the right battery of exercises in order to complete your healing process.  You can use Tylenol over-the-counter for pain because we know that you do not want to use ibuprofen given your history of irritable bowel syndrome.

## 2019-11-23 NOTE — Assessment & Plan Note (Signed)
Healing appropriately, patient still following officially with PT, function improving with no new complaints

## 2019-12-10 ENCOUNTER — Ambulatory Visit (INDEPENDENT_AMBULATORY_CARE_PROVIDER_SITE_OTHER): Payer: BC Managed Care – PPO | Admitting: Sports Medicine

## 2019-12-10 ENCOUNTER — Ambulatory Visit
Admission: RE | Admit: 2019-12-10 | Discharge: 2019-12-10 | Disposition: A | Discharge status: Home / Self Care | Payer: BC Managed Care – PPO | Source: Ambulatory Visit | Attending: Sports Medicine | Admitting: Sports Medicine

## 2019-12-10 ENCOUNTER — Other Ambulatory Visit: Payer: Self-pay

## 2019-12-10 VITALS — BP 122/86 | Ht 65.0 in | Wt 166.0 lb

## 2019-12-10 DIAGNOSIS — M25571 Pain in right ankle and joints of right foot: Secondary | ICD-10-CM

## 2019-12-11 NOTE — Progress Notes (Signed)
   Subjective:    Patient ID: Sierra Pittman, female    DOB: 04-30-1971, 49 y.o.   MRN: [id number]  HPI   Mishel comes in today for follow-up on a right ankle sprain.  Her swelling has improved but she still has significant pain along the lateral ankle and foot.  Still walking with a significant limp.  She is wearing her med spec brace.  She has been using her crutches as well.  X-rays done at a local urgent care immediately after her injury 2 weeks ago were reportedly negative.    Review of Systems As above    Objective:   Physical Exam  Developed, well nourished.  No acute distress.  Awake alert and oriented x3.  Vital signs reviewed  Right ankle: Patient has slightly decreased ankle range of motion.  Previous soft tissue swelling has resolved.  She is still tender to palpation along the distal fibula. Right foot: Tenderness to palpation at the base of the fifth metatarsal.  Residual ecchymosis diffusely across the dorsum of the foot.   Continues to walk with a noticeable limp endorsing pain primarily at pushoff.  X-rays of the right ankle including AP, lateral, and mortise views show no obvious fracture. X-rays of the right foot including AP, lateral, and oblique views show a comminuted nondisplaced fracture at the base of the fifth metatarsal      Assessment & Plan:   Comminuted nondisplaced fracture at the base of the right fifth metatarsal  Fracture is comminuted and in the watershed area.  Patient is placed into a cam walker with instructions to be nonweightbearing on crutches until her appointment with Dr. Susa Simmonds.  Further treatment will be deferred to the discretion of Dr. Susa Simmonds and the patient will follow up with me as needed.

## 2020-04-01 ENCOUNTER — Other Ambulatory Visit: Payer: Self-pay

## 2020-04-01 ENCOUNTER — Ambulatory Visit: Payer: BC Managed Care – PPO | Admitting: Family Medicine

## 2020-04-01 ENCOUNTER — Encounter: Payer: Self-pay | Admitting: Family Medicine

## 2020-04-01 VITALS — BP 130/87 | HR 84 | Temp 97.3°F | Ht 65.0 in | Wt 168.0 lb

## 2020-04-01 DIAGNOSIS — S91209A Unspecified open wound of unspecified toe(s) with damage to nail, initial encounter: Secondary | ICD-10-CM

## 2020-04-01 DIAGNOSIS — S91202A Unspecified open wound of left great toe with damage to nail, initial encounter: Secondary | ICD-10-CM | POA: Diagnosis not present

## 2020-04-01 DIAGNOSIS — G43009 Migraine without aura, not intractable, without status migrainosus: Secondary | ICD-10-CM | POA: Diagnosis not present

## 2020-04-01 DIAGNOSIS — M25562 Pain in left knee: Secondary | ICD-10-CM

## 2020-04-01 MED ORDER — NURTEC 75 MG PO TBDP: 1.0000 | ORAL_TABLET | Freq: Every day | ORAL | 0 refills | Status: DC | PRN

## 2020-04-01 NOTE — Progress Notes (Signed)
Subjective: CC: Toenail issue PCP: Raliegh Ip, DO VPX:TGGYI Sierra Pittman is a 49 y.o. female presenting to clinic today for:  1.  Toenail concern Patient reports that she tripped and injured her left toe a while back.  Her toenail seems to be falling off now.  It is still attached at the base.  She thought that she had an ingrown toenail but that most of the distal aspect of the ingrown toenail out.  She notes that she been performing's foot soaks.  No bleeding.  New nail seems to be growing in  2.  Left knee pain Patient notes that her right knee pain had totally resolved after physical therapy but then she ended up tripping and breaking her foot and subsequently the right knee pain returned.  She is working closely with physical therapy for this.  She has been dependent upon the left knee and most recently when she was getting up from a kneeling position she started to have some pain and stiffness.  She points to the anterior aspect of the knee as a source of pain.  She does feel that it has locked in the past but it does not occur frequently.  Does not report any crepitus.  3.  Migraines Patient reports that her migraines have been more frequent as of late.  She is not sure this is due to increased stressors surrounding her injuries and work or if it has been food related.  Of note she does report that she has cleaned up her diet quite a bit and is essentially gluten-free because she has IBS now.  She has been using her Imitrex with various degrees of relief.   ROS: Per HPI  Allergies  Allergen Reactions  . Amoxicillin Rash  . Histamine Nausea And Vomiting  . Penicillins Rash   Past Medical History:  Diagnosis Date  . Allergy   . Anxiety   . Arthritis   . Colon polyps   . Depression   . Gestational diabetes   . Hypertension   . Pneumonia     Current Outpatient Medications:  .  famotidine (PEPCID) 20 MG tablet, Take 1 tablet (20 mg total) by mouth daily., Disp: 90 tablet,  Rfl: 3 .  Hyoscyamine Sulfate SL (LEVSIN/SL) 0.125 MG SUBL, Place 0.125 mg under the tongue every 6 (six) hours as needed., Disp: 30 tablet, Rfl: 5 .  loratadine (CLARITIN) 10 MG tablet, Take 10 mg by mouth daily., Disp: , Rfl:  .  omeprazole (PRILOSEC) 20 MG capsule, Take 20 mg by mouth daily., Disp: , Rfl:  .  SUMAtriptan (IMITREX) 100 MG tablet, Take 100 mg by mouth as needed. , Disp: , Rfl:  .  valACYclovir (VALTREX) 500 MG tablet, Take 500 mg by mouth daily., Disp: , Rfl:  Social History   Socioeconomic History  . Marital status: Divorced    Spouse name: Not on file  . Number of children: 2  . Years of education: Not on file  . Highest education level: Not on file  Occupational History  . 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 Smoking status: Former Smoker    Quit date: 11/18/2000    Years since quitting: 19.3  . Smokeless tobacco: Never Used  Vaping Use  . Vaping Use: Never used  Substance and Sexual Activity  . Alcohol use: Yes    Comment: occ  . Drug use: No  . Sexual activity: Yes    Birth  control/protection: Other-see comments    Comment: hasn't had a period in over a year  Other Topics Concern  . Not on file  Social History Narrative   Divorced, lives with her 2 children in a 1 story house. Has 1 dog. Drinks 3 diet sodas a day. Exercises 3-5x a week at the gym. Has a bachelors degree. 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   Social Determinants of Health   Financial Resource Strain:   . Difficulty of Paying Living Expenses: Not on file  Food Insecurity:   . Worried About Programme researcher, broadcasting/film/video in the Last Year: Not on file  . Ran Out of Food in the Last Year: Not on file  Transportation Needs:   . Lack of Transportation (Medical): Not on file  . Lack of Transportation (Non-Medical): Not on file  Physical Activity:   . Days of Exercise per Week: Not on file  . Minutes of Exercise per Session: Not on file  Stress:     . Feeling of Stress : Not on file  Social Connections:   . Frequency of Communication with Friends and Family: Not on file  . Frequency of Social Gatherings with Friends and Family: Not on file  . Attends Religious Services: Not on file  . Active Member of Clubs or Organizations: Not on file  . Attends Banker Meetings: Not on file  . Marital Status: Not on file  Intimate Partner Violence:   . Fear of Current or Ex-Partner: Not on file  . Emotionally Abused: Not on file  . Physically Abused: Not on file  . Sexually Abused: Not on file   Family History  Problem Relation Age of Onset  . Hyperlipidemia Mother   . Hypertension Father   . Diabetes Father   . Hyperlipidemia Father   . Dementia Maternal Grandmother   . Prostate cancer Maternal Grandfather   . Hypertension Paternal Grandmother   . Dementia Paternal Grandfather   . Hyperlipidemia Brother   . Colon polyps Brother   . Hyperlipidemia Brother   . Hyperlipidemia Brother   . Colon cancer Neg Hx   . Esophageal cancer Neg Hx     Objective: Office vital signs reviewed. BP 130/87   Pulse 84   Temp (!) 97.3 F (36.3 C)   Ht 5\' 5"  (1.651 m)   Wt 168 lb (76.2 kg)   LMP  (LMP Unknown)   SpO2 97%   BMI 27.96 kg/m   Physical Examination:  General: Awake, alert, well nourished, No acute distress HEENT: Normal, sclera white.  EOMI Extremities: warm, well perfused, No edema, cyanosis or clubbing; +2 pulses bilaterally MSK: Gait is normal.  Range of motion is intact of the left knee.  She has minimal pain with Thessaly maneuver Skin: Left great toenail attached only at the inner base.  She has new nail growing beneath it.  No evidence of secondary infection or complication Neuro: With exception of vision she is neurologically intact  Assessment/ Plan: 49 y.o. female   1. Avulsion of toenail of left foot It is attached by only a little bit of skin.  I think that we can get this to fall off without surgical  intervention.  I have recommended continued foot soaks with gradual peeling back of the skin and nail so that it releases.  There is no evidence of ongoing ingrown toenail today  2. Migraine without aura and without status migrainosus, not intractable Sample of Nurtec provided.  Advised that physical therapy  may be of benefit improving muscle spasm of the neck and shoulders - Rimegepant Sulfate (NURTEC) 75 MG TBDP; Take 1 tablet by mouth daily as needed (migraine).  Dispense: 2 tablet; Refill: 0  3. Acute pain of left knee Likely secondary to right knee and foot injury.  This appears to be meniscal in nature.  We discussed ice, physical therapy and NSAIDs.  She will follow up as needed on this issue with orthopedics if needed   No orders of the defined types were placed in this encounter.  No orders of the defined types were placed in this encounter.    Raliegh Ip, DO [facility] [phone]

## 2020-04-07 ENCOUNTER — Telehealth: Payer: Self-pay

## 2020-04-07 NOTE — Telephone Encounter (Signed)
Advised Mucinex DM OTC, also scheduled televisit for tomorrow with on call provider

## 2020-04-07 NOTE — Telephone Encounter (Signed)
Pt tested neg for covid on Tues but she has a cough. In the morning she coughs up mucus and during the day it is not there. She remembers when she had covid last fall it did not feel the way she feels today. Pt is aware no ov for today. She wants to know what she can take OTC. Please call back

## 2020-04-08 ENCOUNTER — Ambulatory Visit (INDEPENDENT_AMBULATORY_CARE_PROVIDER_SITE_OTHER): Payer: BC Managed Care – PPO | Admitting: Family Medicine

## 2020-04-08 ENCOUNTER — Encounter: Payer: Self-pay | Admitting: Family Medicine

## 2020-04-08 DIAGNOSIS — R0981 Nasal congestion: Secondary | ICD-10-CM | POA: Diagnosis not present

## 2020-04-08 MED ORDER — PREDNISONE 20 MG PO TABS: ORAL_TABLET | ORAL | 0 refills | Status: DC

## 2020-04-08 MED ORDER — FLUTICASONE PROPIONATE 50 MCG/ACT NA SUSP: 1.0000 | Freq: Two times a day (BID) | NASAL | 6 refills | Status: DC | PRN

## 2020-04-08 NOTE — Progress Notes (Signed)
Virtual Visit via telephone Note  I connected with Sierra Pittman on 04/08/20 at 1259 by telephone and verified that I am speaking with the correct person using two identifiers. Sierra Pittman is currently located at home and patient are currently with her during visit. The provider, Elige Radon Charlee Whitebread, MD is located in their office at time of visit.  Call ended at 1305  I discussed the limitations, risks, security and privacy concerns of performing an evaluation and management service by telephone and the availability of in person appointments. I also discussed with the patient that there may be a patient responsible charge related to this service. The patient expressed understanding and agreed to proceed.   History and Present Illness: Patient is calling in for 1 week of itchy throat and ears and cough over the weekend.  She denies fevers or chills.  She was feeling tight in chest and mucinex helped and now she had sinus pressure.  She has nasal congestion. The coughing is worse at night.  She feels like it is productive some of the time and she has a lot of congestion.  1. Sinus congestion     Outpatient Encounter Medications as of 04/08/2020  Medication Sig  . famotidine (PEPCID) 20 MG tablet Take 1 tablet (20 mg total) by mouth daily.  . fluticasone (FLONASE) 50 MCG/ACT nasal spray Place 1 spray into both nostrils 2 (two) times daily as needed for allergies or rhinitis.  Marland Kitchen Hyoscyamine Sulfate SL (LEVSIN/SL) 0.125 MG SUBL Place 0.125 mg under the tongue every 6 (six) hours as needed.  . loratadine (CLARITIN) 10 MG tablet Take 10 mg by mouth daily.  Marland Kitchen omeprazole (PRILOSEC) 20 MG capsule Take 20 mg by mouth daily.  . predniSONE (DELTASONE) 20 MG tablet 2 po at same time daily for 5 days  . Rimegepant Sulfate (NURTEC) 75 MG TBDP Take 1 tablet by mouth daily as needed (migraine).  . SUMAtriptan (IMITREX) 100 MG tablet Take 100 mg by mouth as needed.   . valACYclovir (VALTREX) 500 MG tablet  Take 500 mg by mouth daily.   No facility-administered encounter medications on file as of 04/08/2020.    Review of Systems  Constitutional: Negative for chills and fever.  HENT: Positive for congestion, postnasal drip, rhinorrhea, sinus pressure and sneezing. Negative for ear discharge, ear pain and sore throat.   Eyes: Negative for pain, redness and visual disturbance.  Respiratory: Positive for cough. Negative for chest tightness, shortness of breath and wheezing.   Cardiovascular: Negative for chest pain and leg swelling.  Genitourinary: Negative for difficulty urinating and dysuria.  Musculoskeletal: Negative for back pain and gait problem.  Skin: Negative for rash.  Neurological: Negative for light-headedness and headaches.  Psychiatric/Behavioral: Negative for agitation and behavioral problems.  All other systems reviewed and are negative.   Observations/Objective: Patient sounds comfortable and in no acute distress  Assessment and Plan: Problem List Items Addressed This Visit    None    Visit Diagnoses    Sinus congestion    -  Primary   Relevant Medications   fluticasone (FLONASE) 50 MCG/ACT nasal spray   predniSONE (DELTASONE) 20 MG tablet      Will try to treat like possible allergic issues.  Follow-up as needed Follow up plan: Return if symptoms worsen or fail to improve.     I discussed the assessment and treatment plan with the patient. The patient was provided an opportunity to ask questions and all were answered. The patient agreed  with the plan and demonstrated an understanding of the instructions.   The patient was advised to call back or seek an in-person evaluation if the symptoms worsen or if the condition fails to improve as anticipated.  The above assessment and management plan was discussed with the patient. The patient verbalized understanding of and has agreed to the management plan. Patient is aware to call the clinic if symptoms persist or worsen.  Patient is aware when to return to the clinic for a follow-up visit. Patient educated on when it is appropriate to go to the emergency department.    I provided 6 minutes of non-face-to-face time during this encounter.    Nils Pyle, MD

## 2020-06-21 ENCOUNTER — Other Ambulatory Visit: Payer: Self-pay | Admitting: Family Medicine

## 2020-06-27 ENCOUNTER — Ambulatory Visit (INDEPENDENT_AMBULATORY_CARE_PROVIDER_SITE_OTHER): Payer: Self-pay | Admitting: Family Medicine

## 2020-06-27 ENCOUNTER — Encounter: Payer: Self-pay | Admitting: Family Medicine

## 2020-06-27 DIAGNOSIS — J40 Bronchitis, not specified as acute or chronic: Secondary | ICD-10-CM

## 2020-06-27 DIAGNOSIS — J329 Chronic sinusitis, unspecified: Secondary | ICD-10-CM

## 2020-06-27 MED ORDER — ONDANSETRON 4 MG PO TBDP: 4.0000 mg | ORAL_TABLET | Freq: Four times a day (QID) | ORAL | 1 refills | Status: DC | PRN

## 2020-06-27 MED ORDER — AZITHROMYCIN 250 MG PO TABS: ORAL_TABLET | ORAL | 0 refills | Status: DC

## 2020-06-27 MED ORDER — PREDNISONE 10 MG PO TABS: ORAL_TABLET | ORAL | 0 refills | Status: DC

## 2020-06-27 NOTE — Progress Notes (Signed)
Subjective:    Patient ID: Sierra Pittman, female    DOB: 1970-12-20, 50 y.o.   MRN: [id number]   HPI: Sierra Pittman is a 50 y.o. female presenting for onset 01/08  With fever, chills and cough. Tested CoVID positive on 1/10. Fever has been up and done. Nausea, but no vomiting. Runny nose, sneezing. Got a cough syrup and steroid. Just started them today.     Depression screen [facility] 2/9 04/01/2020 07/10/2019 06/24/2018 11/18/2017 12/10/2016  Decreased Interest 0 0 0 - 0  Down, Depressed, Hopeless 0 0 0 0 0  PHQ - 2 Score 0 0 0 0 0  Altered sleeping - 0 0 - -  Tired, decreased energy - 0 0 - -  Change in appetite - 0 0 - -  Feeling bad or failure about yourself  - 0 0 - -  Trouble concentrating - 0 0 - -  Moving slowly or fidgety/restless - 0 0 - -  Suicidal thoughts - 0 0 - -  PHQ-9 Score - 0 0 - -  Difficult doing work/chores - - Not difficult at all - -     Relevant past medical, surgical, family and social history reviewed and updated as indicated.  Interim medical history since our last visit reviewed. Allergies and medications reviewed and updated.  ROS:  Review of Systems  Constitutional: Negative for activity change, appetite change, chills and fever.  HENT: Positive for congestion, postnasal drip and sinus pressure. Negative for ear discharge, ear pain, hearing loss, nosebleeds, sneezing and trouble swallowing.   Respiratory: Negative for chest tightness and shortness of breath.   Cardiovascular: Negative for chest pain and palpitations.  Skin: Negative for rash.     Social History   Tobacco Use  Smoking Status Former Smoker  . Quit date: 11/18/2000  . Years since quitting: 19.6  Smokeless Tobacco Never Used       Objective:     Wt Readings from Last 3 Encounters:  04/01/20 168 lb (76.2 kg)  12/10/19 166 lb (75.3 kg)  11/23/19 166 lb (75.3 kg)     Exam deferred. Pt. Harboring due to COVID 19. Phone visit performed.   Assessment & Plan:   1. Sinobronchitis      Meds ordered this encounter  Medications  . ondansetron (ZOFRAN-ODT) 4 MG disintegrating tablet    Sig: Take 1 tablet (4 mg total) by mouth every 6 (six) hours as needed for nausea or vomiting.    Dispense:  20 tablet    Refill:  1  . predniSONE (DELTASONE) 10 MG tablet    Sig: Take 5 daily for 2 days followed by 4,3,2 and 1 for 2 days each.    Dispense:  30 tablet    Refill:  0  . azithromycin (ZITHROMAX Z-PAK) 250 MG tablet    Sig: Take two right away Then one a day for the next 4 days.    Dispense:  6 each    Refill:  0    No orders of the defined types were placed in this encounter.     Diagnoses and all orders for this visit:  Sinobronchitis  Other orders -     ondansetron (ZOFRAN-ODT) 4 MG disintegrating tablet; Take 1 tablet (4 mg total) by mouth every 6 (six) hours as needed for nausea or vomiting. -     predniSONE (DELTASONE) 10 MG tablet; Take 5 daily for 2 days followed by 4,3,2 and 1 for 2 days each. -  azithromycin (ZITHROMAX Z-PAK) 250 MG tablet; Take two right away Then one a day for the next 4 days.    Virtual Visit via telephone Note  I discussed the limitations, risks, security and privacy concerns of performing an evaluation and management service by telephone and the availability of in person appointments. The patient was identified with two identifiers. Pt.expressed understanding and agreed to proceed. Pt. Is at home. Dr. Darlyn Read is in his office.  Follow Up Instructions:   I discussed the assessment and treatment plan with the patient. The patient was provided an opportunity to ask questions and all were answered. The patient agreed with the plan and demonstrated an understanding of the instructions.   The patient was advised to call back or seek an in-person evaluation if the symptoms worsen or if the condition fails to improve as anticipated.   Total minutes including chart review and phone contact time: 16   Follow up plan: No follow-ups  on file.  Mechele Claude, MD [place] [facility]

## 2020-07-01 ENCOUNTER — Ambulatory Visit (INDEPENDENT_AMBULATORY_CARE_PROVIDER_SITE_OTHER): Payer: Self-pay | Admitting: Family Medicine

## 2020-07-01 ENCOUNTER — Ambulatory Visit (INDEPENDENT_AMBULATORY_CARE_PROVIDER_SITE_OTHER): Payer: Self-pay

## 2020-07-01 ENCOUNTER — Encounter: Payer: Self-pay | Admitting: Family Medicine

## 2020-07-01 VITALS — BP 149/106 | HR 103 | Temp 97.8°F

## 2020-07-01 DIAGNOSIS — Z20822 Contact with and (suspected) exposure to covid-19: Secondary | ICD-10-CM

## 2020-07-01 DIAGNOSIS — R059 Cough, unspecified: Secondary | ICD-10-CM

## 2020-07-01 DIAGNOSIS — Z8616 Personal history of COVID-19: Secondary | ICD-10-CM

## 2020-07-01 DIAGNOSIS — J189 Pneumonia, unspecified organism: Secondary | ICD-10-CM

## 2020-07-01 DIAGNOSIS — R5383 Other fatigue: Secondary | ICD-10-CM

## 2020-07-01 DIAGNOSIS — R509 Fever, unspecified: Secondary | ICD-10-CM

## 2020-07-01 MED ORDER — MAGIC MOUTHWASH W/LIDOCAINE: ORAL | 0 refills | Status: DC

## 2020-07-01 MED ORDER — LEVOFLOXACIN 750 MG PO TABS: 750.0000 mg | ORAL_TABLET | Freq: Every day | ORAL | 0 refills | Status: AC

## 2020-07-01 NOTE — Patient Instructions (Signed)
You had labs performed today.  You will be contacted with the results of the labs once they are available, usually in the next 3 business days for routine lab work.  If you have an active my chart account, they will be released to your MyChart.  If you prefer to have these labs released to you via telephone, please let us know.  If you had a pap smear or biopsy performed, expect to be contacted in about 7-10 days.  You have been covered for pneumonia with the ZPAK.  Finish meds as prescribed. I have seen fevers linger in COVID and I think that may be happening but I want to rule out any secondary infection.

## 2020-07-01 NOTE — Progress Notes (Signed)
Subjective: CC: Follow-up febrile illness, COVID-19 PCP: Janora Norlander, DO Sierra Pittman is a 50 y.o. female presenting to clinic today for:  1.  COVID-19 infection Patient was diagnosed 3 weeks ago with COVID-19 infection.  She has ongoing cough and intermittent fevers.  She was recently treated with prednisone and a Z-Pak and is on the last day of those.  She has had fevers into the 101's.  No hemoptysis, brown sputum.  No reports of shortness of breath or wheezing.  She continues to be fatigued and feel ill   ROS: Per HPI  Allergies  Allergen Reactions  . Amoxicillin Rash  . Histamine Nausea And Vomiting  . Penicillins Rash   Past Medical History:  Diagnosis Date  . Allergy   . Anxiety   . Arthritis   . Colon polyps   . Depression   . Gestational diabetes   . Hypertension   . IBS (irritable bowel syndrome)   . Pneumonia     Current Outpatient Medications:  .  azithromycin (ZITHROMAX Z-PAK) 250 MG tablet, Take two right away Then one a day for the next 4 days., Disp: 6 each, Rfl: 0 .  famotidine (PEPCID) 20 MG tablet, TAKE 1 TABLET(20 MG) BY MOUTH DAILY, Disp: 90 tablet, Rfl: 0 .  fluticasone (FLONASE) 50 MCG/ACT nasal spray, Place 1 spray into both nostrils 2 (two) times daily as needed for allergies or rhinitis., Disp: 16 g, Rfl: 6 .  Hyoscyamine Sulfate SL (LEVSIN/SL) 0.125 MG SUBL, Place 0.125 mg under the tongue every 6 (six) hours as needed., Disp: 30 tablet, Rfl: 5 .  loratadine (CLARITIN) 10 MG tablet, Take 10 mg by mouth daily., Disp: , Rfl:  .  omeprazole (PRILOSEC) 20 MG capsule, Take 20 mg by mouth daily., Disp: , Rfl:  .  ondansetron (ZOFRAN-ODT) 4 MG disintegrating tablet, Take 1 tablet (4 mg total) by mouth every 6 (six) hours as needed for nausea or vomiting., Disp: 20 tablet, Rfl: 1 .  predniSONE (DELTASONE) 10 MG tablet, Take 5 daily for 2 days followed by 4,3,2 and 1 for 2 days each., Disp: 30 tablet, Rfl: 0 .  Rimegepant Sulfate (NURTEC) 75 MG  TBDP, Take 1 tablet by mouth daily as needed (migraine)., Disp: 2 tablet, Rfl: 0 .  SUMAtriptan (IMITREX) 100 MG tablet, Take 100 mg by mouth as needed. , Disp: , Rfl:  .  valACYclovir (VALTREX) 500 MG tablet, Take 500 mg by mouth daily., Disp: , Rfl:  Social History   Socioeconomic History  . Marital status: Divorced    Spouse name: Not on file  . Number of children: 2  . Years of education: Not on file  . Highest education level: Not on file  Occupational History  . 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 Smoking status: Former Smoker    Quit date: 11/18/2000    Years since quitting: 19.6  . Smokeless tobacco: Never Used  Vaping Use  . Vaping Use: Never used  Substance and Sexual Activity  . Alcohol use: Yes    Comment: occ  . Drug use: No  . Sexual activity: Yes    Birth control/protection: Other-see comments    Comment: hasn't had a period in over a year  Other Topics Concern  . Not on file  Social History Narrative   Divorced, lives with her 2 children in a 1 story house. Has 1 dog. Drinks 3 diet sodas a day. Exercises 3-5x  a week at the gym. Has a bachelors degree. 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   Social Determinants of Health   Financial Resource Strain: Not on file  Food Insecurity: Not on file  Transportation Needs: Not on file  Physical Activity: Not on file  Stress: Not on file  Social Connections: Not on file  Intimate Partner Violence: Not on file   Family History  Problem Relation Age of Onset  . Hyperlipidemia Mother   . Hypertension Father   . Diabetes Father   . Hyperlipidemia Father   . Dementia Maternal Grandmother   . Prostate cancer Maternal Grandfather   . Hypertension Paternal Grandmother   . Dementia Paternal Grandfather   . Hyperlipidemia Brother   . Colon polyps Brother   . Hyperlipidemia Brother   . Hyperlipidemia Brother   . Colon cancer Neg Hx   . Esophageal cancer Neg Hx      Objective: Office vital signs reviewed. BP (!) 149/106   Pulse (!) 103   Temp 97.8 F (36.6 C) (Temporal)   LMP  (LMP Unknown)   SpO2 96%   Physical Examination:  General: Awake, alert, well nourished, No acute distress HEENT: Normal; no lymphadenopathy.  No oropharyngeal masses or erythema.  TMs intact bilaterally without purulence Cardio: regular rate and rhythm, S1S2 heard, no murmurs appreciated Pulm: Decreased breath sounds throughout.  No appreciable wheezes, rhonchi or rails  DG Chest 2 View  Result Date: 07/01/2020 CLINICAL DATA:  Fever COVID EXAM: CHEST - 2 VIEW COMPARISON:  None. FINDINGS: The heart size and mediastinal contours are within normal limits. Both lungs are clear. The visualized skeletal structures are unremarkable. IMPRESSION: No active cardiopulmonary disease. Electronically Signed   By: Donavan Foil M.D.   On: 07/01/2020 17:17    Assessment/ Plan: 50 y.o. female   Suspected COVID-19 virus infection - Plan: Novel Coronavirus, NAA ([facility]), DG Chest 2 View, CBC with Differential/Platelet, CMP14+EGFR  Other fatigue - Plan: CBC with Differential/Platelet, CMP14+EGFR  Community acquired pneumonia of right lower lobe of lung - Plan: levofloxacin (LEVAQUIN) 750 MG tablet  Personal review of the chest x-ray showed what appeared to be a streaky opacity in the right lower lung field.  I would worry that this is a community-acquired pneumonia that is in the setting of COVID-19.  For that reason I have switched her from azithromycin to Levaquin.  We discussed red flag signs and symptoms warranting further evaluation.  CBC also obtained  No orders of the defined types were placed in this encounter.  No orders of the defined types were placed in this encounter.    Janora Norlander, DO [place] [phone]

## 2020-07-02 LAB — CMP14+EGFR
ALT: 51 IU/L — ABNORMAL HIGH (ref 0–32)
AST: 42 IU/L — ABNORMAL HIGH (ref 0–40)
Albumin/Globulin Ratio: 1.2 (ref 1.2–2.2)
Albumin: 4.1 g/dL (ref 3.8–4.9)
Alkaline Phosphatase: 116 IU/L (ref 44–121)
BUN/Creatinine Ratio: 13 (ref 9–23)
BUN: 10 mg/dL (ref 6–24)
Bilirubin Total: 0.6 mg/dL (ref 0.0–1.2)
CO2: 30 mmol/L — ABNORMAL HIGH (ref 20–29)
Calcium: 9 mg/dL (ref 8.7–10.2)
Chloride: 97 mmol/L (ref 96–106)
Creatinine, Ser: 0.78 mg/dL (ref 0.57–1.00)
GFR calc Af Amer: 102 mL/min/{1.73_m2} (ref >59)
GFR calc non Af Amer: 88 mL/min/{1.73_m2} (ref >59)
Globulin, Total: 3.3 g/dL (ref 1.5–4.5)
Glucose: 118 mg/dL — ABNORMAL HIGH (ref 65–99)
Potassium: 3.1 mmol/L — ABNORMAL LOW (ref 3.5–5.2)
Sodium: 139 mmol/L (ref 134–144)
Total Protein: 7.4 g/dL (ref 6.0–8.5)

## 2020-07-02 LAB — CBC WITH DIFFERENTIAL/PLATELET
Basophils Absolute: 0 10*3/uL (ref 0.0–0.2)
Basos: 1 %
EOS (ABSOLUTE): 0 10*3/uL (ref 0.0–0.4)
Eos: 0 %
Hematocrit: 38.2 % (ref 34.0–46.6)
Hemoglobin: 13.5 g/dL (ref 11.1–15.9)
Immature Grans (Abs): 0.1 10*3/uL (ref 0.0–0.1)
Immature Granulocytes: 2 %
Lymphocytes Absolute: 1.7 10*3/uL (ref 0.7–3.1)
Lymphs: 33 %
MCH: 30 pg (ref 26.6–33.0)
MCHC: 35.3 g/dL (ref 31.5–35.7)
MCV: 85 fL (ref 79–97)
Monocytes Absolute: 0.2 10*3/uL (ref 0.1–0.9)
Monocytes: 3 %
Neutrophils Absolute: 3.2 10*3/uL (ref 1.4–7.0)
Neutrophils: 61 %
Platelets: 189 10*3/uL (ref 150–450)
RBC: 4.5 x10E6/uL (ref 3.77–5.28)
RDW: 12.7 % (ref 11.7–15.4)
WBC: 5.1 10*3/uL (ref 3.4–10.8)

## 2020-07-03 ENCOUNTER — Emergency Department (HOSPITAL_COMMUNITY): Payer: BC Managed Care – PPO

## 2020-07-03 ENCOUNTER — Encounter (HOSPITAL_COMMUNITY): Payer: Self-pay | Admitting: Emergency Medicine

## 2020-07-03 ENCOUNTER — Emergency Department (HOSPITAL_COMMUNITY)
Admission: EM | Admit: 2020-07-03 | Discharge: 2020-07-03 | Disposition: A | Discharge status: Home / Self Care | Payer: BC Managed Care – PPO | Attending: Emergency Medicine | Admitting: Emergency Medicine

## 2020-07-03 ENCOUNTER — Other Ambulatory Visit: Payer: Self-pay

## 2020-07-03 DIAGNOSIS — R509 Fever, unspecified: Secondary | ICD-10-CM | POA: Diagnosis present

## 2020-07-03 DIAGNOSIS — Z87891 Personal history of nicotine dependence: Secondary | ICD-10-CM | POA: Insufficient documentation

## 2020-07-03 DIAGNOSIS — I10 Essential (primary) hypertension: Secondary | ICD-10-CM | POA: Diagnosis not present

## 2020-07-03 DIAGNOSIS — B349 Viral infection, unspecified: Secondary | ICD-10-CM | POA: Insufficient documentation

## 2020-07-03 LAB — CBC WITH DIFFERENTIAL/PLATELET
Abs Immature Granulocytes: 0 10*3/uL (ref 0.00–0.07)
Band Neutrophils: 1 %
Basophils Absolute: 0 10*3/uL (ref 0.0–0.1)
Basophils Relative: 0 %
Blasts: 0 %
Eosinophils Absolute: 0 10*3/uL (ref 0.0–0.5)
Eosinophils Relative: 0 %
HCT: 38.4 % (ref 36.0–46.0)
Hemoglobin: 13.3 g/dL (ref 12.0–15.0)
Lymphocytes Relative: 43 %
Lymphs Abs: 2.3 10*3/uL (ref 0.7–4.0)
MCH: 30.6 pg (ref 26.0–34.0)
MCHC: 34.6 g/dL (ref 30.0–36.0)
MCV: 88.3 fL (ref 80.0–100.0)
Metamyelocytes Relative: 0 %
Monocytes Absolute: 0.2 10*3/uL (ref 0.1–1.0)
Monocytes Relative: 3 %
Myelocytes: 0 %
Neutro Abs: 2.8 10*3/uL (ref 1.7–7.7)
Neutrophils Relative %: 53 %
Other: 0 %
Platelets: 157 10*3/uL (ref 150–400)
Promyelocytes Relative: 0 %
RBC: 4.35 MIL/uL (ref 3.87–5.11)
RDW: 13.4 % (ref 11.5–15.5)
WBC: 5.3 10*3/uL (ref 4.0–10.5)
nRBC: 0 % (ref 0.0–0.2)
nRBC: 0 /100 WBC

## 2020-07-03 LAB — URINALYSIS, ROUTINE W REFLEX MICROSCOPIC
Bacteria, UA: NONE SEEN
Bilirubin Urine: NEGATIVE
Glucose, UA: NEGATIVE mg/dL
Ketones, ur: NEGATIVE mg/dL
Leukocytes,Ua: NEGATIVE
Nitrite: NEGATIVE
Protein, ur: NEGATIVE mg/dL
Specific Gravity, Urine: 1.006 (ref 1.005–1.030)
pH: 7 (ref 5.0–8.0)

## 2020-07-03 LAB — BASIC METABOLIC PANEL
Anion gap: 10 (ref 5–15)
BUN: 12 mg/dL (ref 6–20)
CO2: 27 mmol/L (ref 22–32)
Calcium: 9.1 mg/dL (ref 8.9–10.3)
Chloride: 98 mmol/L (ref 98–111)
Creatinine, Ser: 0.69 mg/dL (ref 0.44–1.00)
GFR, Estimated: >60 mL/min (ref >60)
Glucose, Bld: 130 mg/dL — ABNORMAL HIGH (ref 70–99)
Potassium: 3 mmol/L — ABNORMAL LOW (ref 3.5–5.1)
Sodium: 135 mmol/L (ref 135–145)

## 2020-07-03 LAB — SARS-COV-2, NAA 2 DAY TAT

## 2020-07-03 LAB — NOVEL CORONAVIRUS, NAA: SARS-CoV-2, NAA: NOT DETECTED

## 2020-07-03 MED ORDER — PROCHLORPERAZINE EDISYLATE 10 MG/2ML IJ SOLN
10.0000 mg | Freq: Once | INTRAMUSCULAR | Status: AC
  Administered 2020-07-03: 10 mg via INTRAVENOUS
  Filled 2020-07-03: qty 2

## 2020-07-03 MED ORDER — SODIUM CHLORIDE 0.9 % IV BOLUS
250.0000 mL | Freq: Once | INTRAVENOUS | Status: AC
  Administered 2020-07-03: 250 mL via INTRAVENOUS

## 2020-07-03 MED ORDER — POTASSIUM CHLORIDE CRYS ER 20 MEQ PO TBCR
40.0000 meq | EXTENDED_RELEASE_TABLET | Freq: Once | ORAL | Status: AC
  Administered 2020-07-03: 40 meq via ORAL
  Filled 2020-07-03: qty 2

## 2020-07-03 MED ORDER — ACETAMINOPHEN 325 MG PO TABS
650.0000 mg | ORAL_TABLET | Freq: Once | ORAL | Status: AC
  Administered 2020-07-03: 650 mg via ORAL
  Filled 2020-07-03: qty 2

## 2020-07-03 NOTE — ED Triage Notes (Signed)
Pt to the ED complaining of a Fever ( no tylenol or Ibuprofen taken today) intermittently for the last three weeks.  Patient states she had a positive at home Covid test 3 wks ago. Pt tested negative at [facility] on Friday.  Pt states she feels like she is getting to the point she feels so bad she it incapable of taking care of herself.  Patient has been prescibed a zpack (1 pill remaining),  started levofloxacin Friday, and was started on a Prednisone taper on Tuesday 06/28/2020

## 2020-07-03 NOTE — Discharge Instructions (Addendum)
Seen here for fever and URI symptoms.  Lab work and imaging all looks reassuring, Urine was negative for infection.  Recommend continuing with your antibiotics as prescribed by your primary care provider.  You may take ibuprofen for pain control, Tylenol for fever control.  Please remember to stay hydrated.  Follow-up with your PCP in 1 week's time for reevaluation.  Come back to the emergency department if you develop chest pain, shortness of breath, severe abdominal pain, uncontrolled nausea, vomiting, diarrhea.

## 2020-07-03 NOTE — ED Provider Notes (Signed)
[facility] EMERGENCY DEPARTMENT Provider Note   CSN: [id number] Arrival date & time: 07/03/20  1531     History Chief Complaint  Patient presents with  . Fever    Sierra Pittman is a 50 y.o. female.  HPI   Patient with significant medical history of anxiety, depression, hypertension, IBS presents with chief complaint of fever.  Patient was diagnosed with Covid 19 approximately 3 weeks ago, she endorsed continuing fever 2 weeks after, she saw an urgent care doctor 1 week ago where they started her on azithromycin and prednisone.  She continued to feel unwell and saw her primary care doctor  on Friday, x-ray was concern for possible pneumonia and she was switched from azithromycin to Levaquin.  Patient endorses that she has continued low-grade fevers 100.1, headaches, nasal congestion, slight cough, intermittent nausea and feeling weak.  She endorses that she has not been eating and drinking as well because of a lack of appetite.  she denies abdominal pain, vomiting, general body aches, chest pain or shortness of breath. she has no history of DVTs or PEs, is not on hormone therapy.  She is not immunocompromise.  Patient denies any alleviating factors.  Past Medical History:  Diagnosis Date  . Allergy   . Anxiety   . Arthritis   . Colon polyps   . Depression   . Gestational diabetes   . Hypertension   . IBS (irritable bowel syndrome)   . Pneumonia     Patient Active Problem List   Diagnosis Date Noted  . Sprain of right ankle 11/23/2019  . 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 Chronic pain of right knee 06/24/2018  . Plantar wart, right foot 06/24/2018  . Elevated BP without diagnosis of hypertension 06/24/2018  . Migraine 03/30/2016  . Bunion 03/30/2016  . Post-nasal drip 03/30/2016    Past Surgical History:  Procedure Laterality Date  . COLONOSCOPY  07/13/2019   Arlyn Dunning. Done in [place]  . ESOPHAGOGASTRODUODENOSCOPY      Around 2016 in [place] as well. had a hiatal henia a lot of acid and possibly h. pylori     OB History   No obstetric history on file.     Family History  Problem Relation Age of Onset  . Hyperlipidemia Mother   . Hypertension Father   . Diabetes Father   . Hyperlipidemia Father   . Dementia Maternal Grandmother   . Prostate cancer Maternal Grandfather   . Hypertension Paternal Grandmother   . Dementia Paternal Grandfather   . Hyperlipidemia Brother   . Colon polyps Brother   . Hyperlipidemia Brother   . Hyperlipidemia Brother   . Colon cancer Neg Hx   . Esophageal cancer Neg Hx     Social History   Tobacco Use  . Smoking status: Former Smoker    Quit date: 11/18/2000    Years since quitting: 19.6  . Smokeless tobacco: Never Used  Vaping Use  . Vaping Use: Never used  Substance Use Topics  . Alcohol use: Yes    Comment: occ  . Drug use: No    Home Medications Prior to Admission medications   Medication Sig Start Date End Date Taking? Authorizing Provider  azithromycin (ZITHROMAX Z-PAK) 250 MG tablet Take two right away Then one a day for the next 4 days. 06/27/20   Mechele Claude, MD  famotidine (PEPCID) 20 MG tablet TAKE 1 TABLET(20 MG) BY MOUTH DAILY 06/21/20   Delynn Flavin  M, DO  fluticasone (FLONASE) 50 MCG/ACT nasal spray Place 1 spray into both nostrils 2 (two) times daily as needed for allergies or rhinitis. 04/08/20   Dettinger, Elige Radon, MD  Hyoscyamine Sulfate SL (LEVSIN/SL) 0.125 MG SUBL Place 0.125 mg under the tongue every 6 (six) hours as needed. 08/17/19   Cirigliano, Vito V, DO  levofloxacin (LEVAQUIN) 750 MG tablet Take 1 tablet (750 mg total) by mouth daily for 7 days. 07/01/20 07/08/20  Raliegh Ip, DO  loratadine (CLARITIN) 10 MG tablet Take 10 mg by mouth daily.    [provider]  magic mouthwash w/lidocaine SOLN Gargle and spit 41mL every 6 hours as needed for sore throat. 106mL lidocaine, 73mL nystatin, 60mg  hydrocortisone tab, qs  benadryl total . 07/01/20   07/03/20, DO  omeprazole (PRILOSEC) 20 MG capsule Take 20 mg by mouth daily.    [provider]  ondansetron (ZOFRAN-ODT) 4 MG disintegrating tablet Take 1 tablet (4 mg total) by mouth every 6 (six) hours as needed for nausea or vomiting. 06/27/20   06/29/20, MD  predniSONE (DELTASONE) 10 MG tablet Take 5 daily for 2 days followed by 4,3,2 and 1 for 2 days each. 06/27/20   06/29/20, MD  Rimegepant Sulfate (NURTEC) 75 MG TBDP Take 1 tablet by mouth daily as needed (migraine). 04/01/20   04/03/20, DO  SUMAtriptan (IMITREX) 100 MG tablet Take 100 mg by mouth as needed.  10/28/17   [provider]  valACYclovir (VALTREX) 500 MG tablet Take 500 mg by mouth daily.    [provider]    Allergies    Amoxicillin, Histamine, and Penicillins  Review of Systems   Review of Systems  Constitutional: Positive for appetite change, chills and fever.  HENT: Positive for congestion and sore throat.   Eyes: Negative for visual disturbance.  Respiratory: Positive for cough. Negative for shortness of breath.   Cardiovascular: Negative for chest pain.  Gastrointestinal: Positive for nausea. Negative for abdominal pain, diarrhea and vomiting.  Genitourinary: Negative for dysuria, enuresis and flank pain.  Musculoskeletal: Negative for back pain and myalgias.  Skin: Negative for rash.  Neurological: Positive for weakness and headaches. Negative for dizziness.  Hematological: Does not bruise/bleed easily.    Physical Exam Updated Vital Signs BP 140/85 (BP Location: Right Arm)   Pulse (!) 101   Temp 100.3 F (37.9 C) (Oral)   Resp 17   Ht 5\' 5"  (1.651 m)   Wt 74.8 kg   LMP  (LMP Unknown)   SpO2 96%   BMI 27.46 kg/m   Physical Exam Vitals and nursing note reviewed.  Constitutional:      General: She is not in acute distress.    Appearance: She is not ill-appearing.  HENT:     Head: Normocephalic and  atraumatic.     Right Ear: Tympanic membrane, ear canal and external ear normal.     Left Ear: Tympanic membrane, ear canal and external ear normal.     Nose: No congestion.     Comments: Patient has noted erythematous turbinates.    Mouth/Throat:     Mouth: Mucous membranes are moist.     Pharynx: Oropharynx is clear. No oropharyngeal exudate or posterior oropharyngeal erythema.     Comments: Tongue and uvula are both midline, no erythema or edema, patient is controlling oral secretions Eyes:     Conjunctiva/sclera: Conjunctivae normal.  Cardiovascular:     Rate and Rhythm: Normal rate and regular  rhythm.     Pulses: Normal pulses.     Heart sounds: No murmur heard. No friction rub. No gallop.   Pulmonary:     Effort: No respiratory distress.     Breath sounds: No wheezing, rhonchi or rales.  Abdominal:     Palpations: Abdomen is soft.     Tenderness: There is no abdominal tenderness.  Musculoskeletal:     Cervical back: Normal range of motion.     Comments: Patient is moving all 4 extremities at difficulty.  Skin:    General: Skin is warm and dry.  Neurological:     Mental Status: She is alert.  Psychiatric:        Mood and Affect: Mood normal.     ED Results / Procedures / Treatments   Labs (all labs ordered are listed, but only abnormal results are displayed) Labs Reviewed  BASIC METABOLIC PANEL - Abnormal; Notable for the following components:      Result Value   Potassium 3.0 (*)    Glucose, Bld 130 (*)    All other components within normal limits  URINALYSIS, ROUTINE W REFLEX MICROSCOPIC - Abnormal; Notable for the following components:   Color, Urine STRAW (*)    Hgb urine dipstick SMALL (*)    All other components within normal limits  CBC WITH DIFFERENTIAL/PLATELET    EKG EKG Interpretation  Date/Time:  Sunday July 03 2020 17:02:13 EST Ventricular Rate:  82 PR Interval:  156 QRS Duration: 82 QT Interval:  378 QTC Calculation: 441 R  Axis:   39 Text Interpretation: Normal sinus rhythm T wave abnormality, consider inferior ischemia Abnormal ECG No old tracing to compare Confirmed by Eber Hong ([id number]) on 07/03/2020 6:08:31 PM   Radiology DG Chest Port 1 View  Result Date: 07/03/2020 CLINICAL DATA:  Cough and fever. Positive home COVID test 3 weeks ago negative COVID test on Friday. EXAM: PORTABLE CHEST 1 VIEW COMPARISON:  Radiograph 2 days ago 07/01/2020 FINDINGS: Mild diffuse interstitial coarsening. This is progressed from prior exam. Subsegmental opacity in the lung bases. Heart is normal in size with normal mediastinal contours. No pleural fluid or pneumothorax. No acute osseous abnormalities are seen. IMPRESSION: 1. Mild diffuse interstitial coarsening, increased from prior exam, may represent atypical viral infection. 2. Mild bibasilar opacities likely atelectasis. Electronically Signed   By: Narda Rutherford M.D.   On: 07/03/2020 17:19    Procedures Procedures   Medications Ordered in ED Medications  prochlorperazine (COMPAZINE) injection 10 mg (10 mg Intravenous Given 07/03/20 1724)  sodium chloride 0.9 % bolus 250 mL (0 mLs Intravenous Stopped 07/03/20 1813)  acetaminophen (TYLENOL) tablet 650 mg (650 mg Oral Given 07/03/20 1722)  potassium chloride SA (KLOR-CON) CR tablet 40 mEq (40 mEq Oral Given 07/03/20 1813)    ED Course  I have reviewed the triage vital signs and the nursing notes.  Pertinent labs & imaging results that were available during my care of the patient were reviewed by me and considered in my medical decision making (see chart for details).    MDM Rules/Calculators/A&P                          Initial impression-patient presents with chief complaint of fever.  She is alert, does not appear in acute distress, vital signs reassuring.  Concern for leukocytopenia, CAP, electrolyte derailment.  Will obtain basic lab work, repeat chest x-ray, add on EKG and provide patient with fluids and headache  medication.  Work-up-CBC negative for leukocytosis or anemia.  CMP shows hypokalemia with a potassium of 3.0, hyperglycemia 130, no anion gap present.  Chest x-ray shows mildly diffuse interstitial coarsening increased from prior exam possible atypical infection.  UA is negative for signs of infection.  EKG sinus without signs of ischemia no ST elevation depression noted.  Reassessment patient was reassessed, has no complaints at this time, states she is feeling much better, vital signs have remained stable.  She is tolerating p.o. without difficulty.  Patient is noted to be hypokalemic without EKG changes will provide her with 40 mEq of potassium.  Rule out- Low suspicion for systemic infection as patient is nontoxic-appearing, vital signs reassuring, no obvious source infection noted on exam or within lab work, negative UA or CBC. I have low suspicion for PE as patient denies pleuritic chest pain, shortness of breath, vital signs are reassuring. low suspicion for strep throat as oropharynx was visualized, no erythema or exudates noted.  Low suspicion patient would need hospitalized due to viral infection or Covid as vital signs reassuring, patient is not in respiratory distress.  Patient noted to be hypokalemic suspect this is secondary due to decrease in appetite.  Plan- I suspect patient suffering from viral infection URI as this is noted on x-ray, but cannot exclude the possibility of overlying bacterial infection as it is atypical to continue to have a  fever for 3 weeks.  She is currently on Levaquin, I find this acceptable.  Will  Have her follow-up with PCP in 1 week's time for reevaluation.  Vital signs have remained stable, no indication for hospital admission.  Patient discussed with attending and they agreed with assessment and plan.  Patient given at home care as well strict return precautions.  Patient verbalized that they understood agreed to said plan.   Final Clinical Impression(s) /  ED Diagnoses Final diagnoses:  Viral illness    Rx / DC Orders ED Discharge Orders    None       Carroll Sage, PA-C 07/03/20 1840    Eber Hong, MD 07/06/20 [phone]

## 2020-07-04 ENCOUNTER — Telehealth: Payer: Self-pay | Admitting: *Deleted

## 2020-07-04 NOTE — Telephone Encounter (Signed)
Transition Care Management Unsuccessful Follow-up Telephone Call  Date of discharge and from where:  07/03/2020 - [facility]  Attempts:  1st Attempt  Reason for unsuccessful TCM follow-up call:  Left voice message

## 2020-07-05 NOTE — Telephone Encounter (Signed)
Transition Care Management Follow-up Telephone Call  Date of discharge and from where: 07/03/2020 - [facility]  How have you been since you were released from the hospital? "Doing better"  Any questions or concerns? No  Items Reviewed:  Did the pt receive and understand the discharge instructions provided? Yes   Medications obtained and verified? Yes   Other? N/A  Any new allergies since your discharge? No   Dietary orders reviewed? Yes  Do you have support at home? Yes   Home Care and Equipment/Supplies: Were home health services ordered? not applicable If so, what is the name of the agency? N/A  Has the agency set up a time to come to the patient's home? not applicable Were any new equipment or medical supplies ordered?  No What is the name of the medical supply agency? N/A Were you able to get the supplies/equipment? not applicable Do you have any questions related to the use of the equipment or supplies? No  Functional Questionnaire: (I = Independent and D = Dependent) ADLs: I  Bathing/Dressing- I  Meal Prep- I  Eating- I  Maintaining continence- I  Transferring/Ambulation- I  Managing Meds- I  Follow up appointments reviewed:   PCP Hospital f/u appt confirmed? No  - Patient will call to make appointment if necessary.   Specialist Hospital f/u appt confirmed? No  - No Specialty appointment needed.  Are transportation arrangements needed? No   If their condition worsens, is the pt aware to call PCP or go to the Emergency Dept.? Yes  Was the patient provided with contact information for the PCP's office or ED? Yes  Was to pt encouraged to call back with questions or concerns? Yes

## 2020-07-11 ENCOUNTER — Ambulatory Visit (INDEPENDENT_AMBULATORY_CARE_PROVIDER_SITE_OTHER): Payer: BC Managed Care – PPO | Admitting: Nurse Practitioner

## 2020-07-11 ENCOUNTER — Encounter: Payer: Self-pay | Admitting: Nurse Practitioner

## 2020-07-11 DIAGNOSIS — R0989 Other specified symptoms and signs involving the circulatory and respiratory systems: Secondary | ICD-10-CM | POA: Insufficient documentation

## 2020-07-11 DIAGNOSIS — R509 Fever, unspecified: Secondary | ICD-10-CM | POA: Diagnosis not present

## 2020-07-11 MED ORDER — ACETAMINOPHEN 500 MG PO TABS: 500.0000 mg | ORAL_TABLET | Freq: Four times a day (QID) | ORAL | 0 refills | Status: DC | PRN

## 2020-07-11 MED ORDER — DM-GUAIFENESIN ER 30-600 MG PO TB12: 1.0000 | ORAL_TABLET | Freq: Two times a day (BID) | ORAL | 0 refills | Status: DC

## 2020-07-11 NOTE — Assessment & Plan Note (Signed)
Patient reporting congestion, fever, and chills in the last few days.  Symptoms not well controlled.  Patient recently treated for pneumonia and Covid.  With antibiotic and prednisone. Advised patient to continue Tylenol for fever, guaifenesin for cough and congestion. Follow-up in 3 to 7 days with unresolved fever and uncontrolled symptoms.  Rx sent to pharmacy. Education provided to patient, patient verbalized understanding.

## 2020-07-11 NOTE — Patient Instructions (Signed)
Fever, Adult     A fever is an increase in your body's temperature. It often means a temperature of 100.4F (38C) or higher. Brief mild or moderate fevers often have no long-term effects. They often do not need treatment. Moderate or high fevers may make you feel uncomfortable. Sometimes, they can be a sign of a serious illness or disease. A fever that keeps coming back or that lasts a long time may cause you to lose water in your body (get dehydrated). You can take your temperature with a thermometer to see if you have a fever. Temperature can change with:  Age.  Time of day.  Where the thermometer is put in the body. Readings may vary when the thermometer is put: ? In the mouth (oral). ? In the butt (rectal). ? In the ear (tympanic). ? Under the arm (axillary). ? On the forehead (temporal). Follow these instructions at home: Medicines  Take over-the-counter and prescription medicines only as told by your doctor. Follow the dosing instructions carefully.  If you were prescribed an antibiotic medicine, take it as told by your doctor. Do not stop taking it even if you start to feel better. General instructions  Watch for any changes in your symptoms. Tell your doctor about them.  Rest as needed.  Drink enough fluid to keep your pee (urine) pale yellow.  Sponge yourself or bathe with room-temperature water as needed. This helps to lower your body temperature. Do not use ice water.  Do not use too many blankets or wear clothes that are too heavy.  If your fever was caused by an infection that spreads from person to person (is contagious), such as a cold or the flu: ? You should stay home from work and public places for at least 24 hours after your fever is gone. ? Your fever should be gone for at least 24 hours without the need to use medicines. Contact a doctor if:  You throw up (vomit).  You cannot eat or drink without throwing up.  You have watery poop (diarrhea).  It  hurts when you pee.  Your symptoms do not get better with treatment.  You have new symptoms.  You feel very weak. Get help right away if:  You are short of breath or have trouble breathing.  You are dizzy or you pass out (faint).  You feel mixed up (confused).  You have signs of not having enough water in your body, such as: ? Dark pee, very little pee, or no pee. ? Cracked lips. ? Dry mouth. ? Sunken eyes. ? Sleepiness. ? Weakness.  You have very bad pain in your belly (abdomen).  You keep throwing up or having watery poop.  You have a rash on your skin.  Your symptoms get worse all of a sudden. Summary  A fever is an increase in your body's temperature. It often means a temperature of 100.4F (38C) or higher.  Watch for any changes in your symptoms. Tell your doctor about them.  Take all medicines only as told by your doctor.  Do not go to work or other public places if your fever was caused by an illness that can spread to other people.  Get help right away if you have signs that you do not have enough water in your body. This information is not intended to replace advice given to you by your health care provider. Make sure you discuss any questions you have with your health care provider. Document Revised: 11/04/2017   Document Reviewed: 11/04/2017 Elsevier Patient Education  2021 [employer] Inc.  

## 2020-07-11 NOTE — Progress Notes (Signed)
   Virtual Visit via telephone Note Due to COVID-19 pandemic this visit was conducted virtually. This visit type was conducted due to national recommendations for restrictions regarding the COVID-19 Pandemic (e.g. social distancing, sheltering in place) in an effort to limit this patient's exposure and mitigate transmission in our community. All issues noted in this document were discussed and addressed.  A physical exam was not performed with this format.  I connected with Sierra Pittman on 07/11/20 at 9:45 AM  by telephone and verified that I am speaking with the correct person using two identifiers. Sierra Pittman is currently located at home during visit. The provider, Daryll Drown, NP is located in their office at time of visit.  I discussed the limitations, risks, security and privacy concerns of performing an evaluation and management service by telephone and the availability of in person appointments. I also discussed with the patient that there may be a patient responsible charge related to this service. The patient expressed understanding and agreed to proceed.   History and Present Illness:  Fever  This is a recurrent problem. The current episode started in the past 7 days. The problem occurs daily. The problem has been unchanged. Her temperature was unmeasured prior to arrival. Associated symptoms include congestion and coughing. Pertinent negatives include no abdominal pain, ear pain, muscle aches, nausea or wheezing. She has tried acetaminophen for the symptoms. The treatment provided mild relief.      Review of Systems  Constitutional: Positive for chills and fever.  HENT: Positive for congestion. Negative for ear pain and sinus pain.   Respiratory: Positive for cough. Negative for shortness of breath and wheezing.   Gastrointestinal: Negative for abdominal pain and nausea.  Musculoskeletal: Negative for myalgias.  All other systems reviewed and are  negative.    Observations/Objective:  Telephone visit.  Patient did not sound to be in distress.  Assessment and Plan: Chest congestion Patient reporting congestion, fever, and chills in the last few days.  Symptoms not well controlled.  Patient recently treated for pneumonia and Covid.  With antibiotic and prednisone. Advised patient to continue Tylenol for fever, guaifenesin for cough and congestion. Follow-up in 3 to 7 days with unresolved fever and uncontrolled symptoms.  Rx sent to pharmacy. Education provided to patient, patient verbalized understanding.     Follow Up Instructions:  Follow-up with worsening or unresolved symptoms   I discussed the assessment and treatment plan with the patient. The patient was provided an opportunity to ask questions and all were answered. The patient agreed with the plan and demonstrated an understanding of the instructions.   The patient was advised to call back or seek an in-person evaluation if the symptoms worsen or if the condition fails to improve as anticipated.  The above assessment and management plan was discussed with the patient. The patient verbalized understanding of and has agreed to the management plan. Patient is aware to call the clinic if symptoms persist or worsen. Patient is aware when to return to the clinic for a follow-up visit. Patient educated on when it is appropriate to go to the emergency department.   Time call ended: 9:54 AM  I provided 9 minutes of non-face-to-face time during this encounter.    Daryll Drown, NP

## 2020-07-14 ENCOUNTER — Telehealth: Payer: Self-pay

## 2020-07-14 NOTE — Telephone Encounter (Signed)
Spoke with patient, advised her since she is running a fever she will need to do a telephone visit instead.  Changed appointment to tele visit.  Patient reports she spikes a fever every evening.

## 2020-07-14 NOTE — Telephone Encounter (Signed)
Pt tested positive for covid 07/01/2020. She had a televisit with JE on 07/11/2020. Pt was told to call back if still having fever and follow up in 3-7 days. She also wants to go ahead with the labs. She is scheduled to come in 07/18/2020 to see [facility]. She may still have a fever but it is side effect from covid. Is it ok to come in? If not, make a televisit--please call back to let patient know.

## 2020-07-18 ENCOUNTER — Ambulatory Visit (INDEPENDENT_AMBULATORY_CARE_PROVIDER_SITE_OTHER): Payer: BC Managed Care – PPO | Admitting: Family Medicine

## 2020-07-18 DIAGNOSIS — R1011 Right upper quadrant pain: Secondary | ICD-10-CM

## 2020-07-18 DIAGNOSIS — E876 Hypokalemia: Secondary | ICD-10-CM

## 2020-07-18 DIAGNOSIS — U099 Post covid-19 condition, unspecified: Secondary | ICD-10-CM

## 2020-07-18 DIAGNOSIS — R9389 Abnormal findings on diagnostic imaging of other specified body structures: Secondary | ICD-10-CM | POA: Diagnosis not present

## 2020-07-18 DIAGNOSIS — R5383 Other fatigue: Secondary | ICD-10-CM | POA: Diagnosis not present

## 2020-07-18 NOTE — Addendum Note (Signed)
Addended by: Raliegh Ip on: 07/18/2020 12:48 PM   Modules accepted: Level of Service

## 2020-07-18 NOTE — Progress Notes (Signed)
Telephone visit  Subjective: CC: post COVID syndrome PCP: Janora Norlander, DO PPH:KFEXM Sierra Pittman is a 50 y.o. female calls for telephone consult today. Patient provides verbal consent for consult held via phone.  Due to COVID-19 pandemic this visit was conducted virtually. This visit type was conducted due to national recommendations for restrictions regarding the COVID-19 Pandemic (e.g. social distancing, sheltering in place) in an effort to limit this patient's exposure and mitigate transmission in our community. All issues noted in this document were discussed and addressed.  A physical exam was not performed with this format.   Location of patient: home Location of provider: [facility] Others present for call: none  1. Post Viral syndrome She was seen in the ED on 1/30 and was noted to be hypokalemic.  She was repleted and hydrated.  Her symptoms were thought to be secondary to dehydration.  She feels like she is finally turning a corner.  She has been recovering from a febrile illness for almost 5 weeks now.  She has been afebrile >24 hours now.  She continues to have intermittent rib/ chest pain.  She has been challenging herself with walking/ exercise and it seems to be notable after those activity.  Not taking any NSAIDs.  Chest is getting better.   2.  Right upper quadrant pain Patient reports ongoing right upper quadrant pain that seems to be more prominent when she is wearing tight clothing and pressing into the right upper quadrant.  She is had chronic IBS and does admit to having increased abdominal pain with things like peanut butter.  She has since eliminated those types of foods from her diet and things seem to be getting better but again still has some right upper quadrant tenderness.  Not running any more fevers.  No vomiting.  She is tolerating p.o. intake without difficulty now but has not yet gone back to her normal regimen for eating.   ROS: Per HPI  Allergies  Allergen  Reactions  . Amoxicillin Rash  . Histamine Nausea And Vomiting  . Penicillins Rash   Past Medical History:  Diagnosis Date  . Allergy   . Anxiety   . Arthritis   . Colon polyps   . Depression   . Gestational diabetes   . Hypertension   . IBS (irritable bowel syndrome)   . Pneumonia     Current Outpatient Medications:  .  acetaminophen (TYLENOL) 500 MG tablet, Take 1 tablet (500 mg total) by mouth every 6 (six) hours as needed., Disp: 30 tablet, Rfl: 0 .  azithromycin (ZITHROMAX Z-PAK) 250 MG tablet, Take two right away Then one a day for the next 4 days., Disp: 6 each, Rfl: 0 .  dextromethorphan-guaiFENesin (MUCINEX DM) 30-600 MG 12hr tablet, Take 1 tablet by mouth 2 (two) times daily., Disp: 20 tablet, Rfl: 0 .  famotidine (PEPCID) 20 MG tablet, TAKE 1 TABLET(20 MG) BY MOUTH DAILY, Disp: 90 tablet, Rfl: 0 .  fluticasone (FLONASE) 50 MCG/ACT nasal spray, Place 1 spray into both nostrils 2 (two) times daily as needed for allergies or rhinitis., Disp: 16 g, Rfl: 6 .  Hyoscyamine Sulfate SL (LEVSIN/SL) 0.125 MG SUBL, Place 0.125 mg under the tongue every 6 (six) hours as needed., Disp: 30 tablet, Rfl: 5 .  loratadine (CLARITIN) 10 MG tablet, Take 10 mg by mouth daily., Disp: , Rfl:  .  magic mouthwash w/lidocaine SOLN, Gargle and spit 36m every 6 hours as needed for sore throat. 652mlidocaine, 607mystatin, 3m68mdrocortisone  tab, qs benadryl total 422m., Disp: 480 mL, Rfl: 0 .  omeprazole (PRILOSEC) 20 MG capsule, Take 20 mg by mouth daily., Disp: , Rfl:  .  ondansetron (ZOFRAN-ODT) 4 MG disintegrating tablet, Take 1 tablet (4 mg total) by mouth every 6 (six) hours as needed for nausea or vomiting., Disp: 20 tablet, Rfl: 1 .  predniSONE (DELTASONE) 10 MG tablet, Take 5 daily for 2 days followed by 4,3,2 and 1 for 2 days each., Disp: 30 tablet, Rfl: 0 .  Rimegepant Sulfate (NURTEC) 75 MG TBDP, Take 1 tablet by mouth daily as needed (migraine)., Disp: 2 tablet, Rfl: 0 .  SUMAtriptan  (IMITREX) 100 MG tablet, Take 100 mg by mouth as needed. , Disp: , Rfl:  .  valACYclovir (VALTREX) 500 MG tablet, Take 500 mg by mouth daily., Disp: , Rfl:   Assessment/ Plan: 50y.o. female   Post-COVID syndrome - Plan: CBC  Fatigue, unspecified type - Plan: Vitamin B12, Thyroid Panel With TSH, CBC, CMP14+EGFR  Abnormal chest x-ray - Plan: DG Chest 2 View  Hypokalemia - Plan: Magnesium, CMP14+EGFR, CANCELED: Basic metabolic panel  RUQ pain - Plan: [place]dom[place] Limited RUQ (LIVER/GB)  I suspect her ongoing fatigue is related to a post Covid syndrome.  We will plan to recheck CBC given what appeared to be a pneumonia.  Plan for repeat chest x-ray at the end of the month as well.  She knows that this is scheduled.  I will also check for other metabolic etiologies of fatigue including vitamin B12, thyroid, ongoing hypokalemia  With regard to her right upper quadrant pain, could not find previous abdominal ultrasound that evaluated the gallbladder but her symptoms are suspicious for this.  We will plan for ultrasound of gallbladder.  Further plan pending results  Start time: 11:13am End time: 11:35am  Total time spent on patient care (including telephone call/ virtual visit): 22 minutes  [place]([phone]

## 2020-07-19 ENCOUNTER — Telehealth: Payer: Self-pay | Admitting: Family Medicine

## 2020-07-27 ENCOUNTER — Ambulatory Visit (HOSPITAL_COMMUNITY)
Admission: RE | Admit: 2020-07-27 | Discharge: 2020-07-27 | Disposition: A | Discharge status: Home / Self Care | Payer: BC Managed Care – PPO | Source: Ambulatory Visit | Attending: Family Medicine | Admitting: Family Medicine

## 2020-07-27 DIAGNOSIS — R1011 Right upper quadrant pain: Secondary | ICD-10-CM | POA: Insufficient documentation

## 2020-07-31 ENCOUNTER — Encounter: Payer: Self-pay | Admitting: Family Medicine

## 2020-08-01 ENCOUNTER — Other Ambulatory Visit: Payer: BC Managed Care – PPO

## 2020-08-01 ENCOUNTER — Other Ambulatory Visit: Payer: Self-pay

## 2020-08-01 DIAGNOSIS — R5383 Other fatigue: Secondary | ICD-10-CM

## 2020-08-01 DIAGNOSIS — U099 Post covid-19 condition, unspecified: Secondary | ICD-10-CM

## 2020-08-01 DIAGNOSIS — E876 Hypokalemia: Secondary | ICD-10-CM

## 2020-08-02 ENCOUNTER — Other Ambulatory Visit: Payer: Self-pay | Admitting: Family Medicine

## 2020-08-02 DIAGNOSIS — R1011 Right upper quadrant pain: Secondary | ICD-10-CM

## 2020-08-02 LAB — CMP14+EGFR
ALT: 15 IU/L (ref 0–32)
AST: 27 IU/L (ref 0–40)
Albumin/Globulin Ratio: 1.6 (ref 1.2–2.2)
Albumin: 4.3 g/dL (ref 3.8–4.9)
Alkaline Phosphatase: 75 IU/L (ref 44–121)
BUN/Creatinine Ratio: 15 (ref 9–23)
BUN: 11 mg/dL (ref 6–24)
Bilirubin Total: 0.6 mg/dL (ref 0.0–1.2)
CO2: 22 mmol/L (ref 20–29)
Calcium: 9.3 mg/dL (ref 8.7–10.2)
Chloride: 105 mmol/L (ref 96–106)
Creatinine, Ser: 0.74 mg/dL (ref 0.57–1.00)
Globulin, Total: 2.7 g/dL (ref 1.5–4.5)
Glucose: 89 mg/dL (ref 65–99)
Potassium: 3.9 mmol/L (ref 3.5–5.2)
Sodium: 143 mmol/L (ref 134–144)
Total Protein: 7 g/dL (ref 6.0–8.5)
eGFR: 98 mL/min/{1.73_m2} (ref >59)

## 2020-08-02 LAB — THYROID PANEL WITH TSH
Free Thyroxine Index: 2 (ref 1.2–4.9)
T3 Uptake Ratio: 23 % — ABNORMAL LOW (ref 24–39)
T4, Total: 8.5 ug/dL (ref 4.5–12.0)
TSH: 2.65 u[IU]/mL (ref 0.450–4.500)

## 2020-08-02 LAB — CBC
Hematocrit: 37.5 % (ref 34.0–46.6)
Hemoglobin: 13 g/dL (ref 11.1–15.9)
MCH: 30.9 pg (ref 26.6–33.0)
MCHC: 34.7 g/dL (ref 31.5–35.7)
MCV: 89 fL (ref 79–97)
Platelets: 146 10*3/uL — ABNORMAL LOW (ref 150–450)
RBC: 4.21 x10E6/uL (ref 3.77–5.28)
RDW: 14.3 % (ref 11.7–15.4)
WBC: 4.9 10*3/uL (ref 3.4–10.8)

## 2020-08-02 LAB — VITAMIN B12: Vitamin B-12: 419 pg/mL (ref 232–1245)

## 2020-08-02 LAB — MAGNESIUM: Magnesium: 2 mg/dL (ref 1.6–2.3)

## 2020-08-02 NOTE — Telephone Encounter (Signed)
Referral placed.

## 2020-08-04 ENCOUNTER — Other Ambulatory Visit: Payer: Self-pay | Admitting: Family Medicine

## 2020-08-04 DIAGNOSIS — N1330 Unspecified hydronephrosis: Secondary | ICD-10-CM

## 2020-08-11 ENCOUNTER — Other Ambulatory Visit: Payer: Self-pay

## 2020-08-11 ENCOUNTER — Other Ambulatory Visit (INDEPENDENT_AMBULATORY_CARE_PROVIDER_SITE_OTHER): Payer: BC Managed Care – PPO

## 2020-08-11 ENCOUNTER — Ambulatory Visit (HOSPITAL_COMMUNITY)
Admission: RE | Admit: 2020-08-11 | Discharge: 2020-08-11 | Disposition: A | Discharge status: Home / Self Care | Payer: BC Managed Care – PPO | Source: Ambulatory Visit | Attending: Family Medicine | Admitting: Family Medicine

## 2020-08-11 DIAGNOSIS — N1330 Unspecified hydronephrosis: Secondary | ICD-10-CM | POA: Insufficient documentation

## 2020-08-11 DIAGNOSIS — R9389 Abnormal findings on diagnostic imaging of other specified body structures: Secondary | ICD-10-CM

## 2020-08-18 ENCOUNTER — Ambulatory Visit: Payer: BC Managed Care – PPO | Admitting: Gastroenterology

## 2020-09-19 ENCOUNTER — Other Ambulatory Visit: Payer: Self-pay | Admitting: Family Medicine

## 2020-09-22 ENCOUNTER — Other Ambulatory Visit (INDEPENDENT_AMBULATORY_CARE_PROVIDER_SITE_OTHER): Payer: BC Managed Care – PPO

## 2020-09-22 ENCOUNTER — Other Ambulatory Visit: Payer: Self-pay

## 2020-09-22 ENCOUNTER — Ambulatory Visit (INDEPENDENT_AMBULATORY_CARE_PROVIDER_SITE_OTHER): Payer: BC Managed Care – PPO | Admitting: Gastroenterology

## 2020-09-22 ENCOUNTER — Encounter: Payer: Self-pay | Admitting: Gastroenterology

## 2020-09-22 VITALS — BP 118/74 | HR 81 | Ht 65.0 in | Wt 171.4 lb

## 2020-09-22 DIAGNOSIS — Z86010 Personal history of colonic polyps: Secondary | ICD-10-CM

## 2020-09-22 DIAGNOSIS — K219 Gastro-esophageal reflux disease without esophagitis: Secondary | ICD-10-CM | POA: Diagnosis not present

## 2020-09-22 DIAGNOSIS — R140 Abdominal distension (gaseous): Secondary | ICD-10-CM | POA: Diagnosis not present

## 2020-09-22 DIAGNOSIS — R1031 Right lower quadrant pain: Secondary | ICD-10-CM

## 2020-09-22 DIAGNOSIS — K449 Diaphragmatic hernia without obstruction or gangrene: Secondary | ICD-10-CM | POA: Diagnosis not present

## 2020-09-22 NOTE — Patient Instructions (Signed)
If you are age 52 or older, your body mass index should be between 23-30. Your Body mass index is 28.52 kg/m. If this is out of the aforementioned range listed, please consider follow up with your Primary Care Provider.  If you are age 90 or younger, your body mass index should be between 19-25. Your Body mass index is 28.52 kg/m. If this is out of the aformentioned range listed, please consider follow up with your Primary Care Provider.    Please go to the 2nd floor of this building today and schedule your labwork, [facility], [address].   Due to recent changes in healthcare laws, you may see the results of your imaging and laboratory studies on MyChart before your provider has had a chance to review them.  We understand that in some cases there may be results that are confusing or concerning to you. Not all laboratory results come back in the same time frame and the provider may be waiting for multiple results in order to interpret others.  Please give [place] 48 hours in order for your provider to thoroughly review all the results before contacting the office for clarification of your results.   Thank you for choosing me and [facility].  Vito Cirigliano, D.O.

## 2020-09-22 NOTE — Progress Notes (Signed)
P  Chief Complaint:    Abdominal pain  GI History: 50 year old female with a 20+ year history of intermittent GI symptoms. Started as belching and sourbrash followed by diarrhea, n/v and generalized/lower abdominal pain. Occurred sporadically. Ocurs approx 4-5 hours post prandial. No dysphagia. No f/c, weight loss. Can miss work with sxs.  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  She has since transitioned ranitidine to Pepcid due to recall.  Hx of reflux (heartburn, regurgitation, belching, sour brash), now relatively well controlled since starting omeprazole 20 mg/day pre dinner.  EGD 2017 with [facility] for reflux symptoms with small hiatal hernia and duodenitis.  Endoscopic history: -Colonoscopy (07/13/2019, Dr. Rocco Serene, [facility]): 3 hyperplastic polyps.  Repeat in 7-10 years.  No biopsies performed -EGD (09/28/15, Dr. Britta Mccreedy, [facility]): Duodenal bulb duodenitis, hiatal hernia.  H. pylori negative.  -RUQ [place] (date unknown): Normal per review of prior notes -07/10/2019: Normal CBC, CMP, TSH  HPI:     Patient is a 50 y.o. female presenting to the Gastroenterology Clinic for follow-up.  Last seen by me on 08/17/2019 for evaluation 20+ years GI symptoms as outlined above.  Recommended low FODMAP diet and trial course of Levsin.  Was also continued on Claritin and Pepcid as prescribed by her Allergist for possible histamine intolerance.  -07/27/2020: RUQ ultrasound: Mild hepatic steatosis, otherwise normal liver, GB -08/01/2020: Normal CMP, normal CBC (plt 146), B12, TSH - 08/11/2020: Renal ultrasound: Normal, no hydronephrosis  Today, states her main issue is intermittent RLQ pain.  Has been present on/off for a few years.  Pinpoint tenderness in RLQ that is present most days.  Not associate with p.o. intake.  Continues to have generalized abdominal discomfort, bloating.  Did trial  low FODMAP diet and thinks gluten-containing foods cause sxs (bloating), so she has been avoiding these for the most part.  Review of systems:     No chest pain, no SOB, no fevers, no urinary sx   Past Medical History:  Diagnosis Date  . Allergy   . Anxiety   . Arthritis   . Colon polyps   . Depression   . Gestational diabetes   . Hypertension   . IBS (irritable bowel syndrome)   . Pneumonia     Patient's surgical history, family medical history, social history, medications and allergies were all reviewed in Epic    Current Outpatient Medications  Medication Sig Dispense Refill  . acetaminophen (TYLENOL) 500 MG tablet Take 1 tablet (500 mg total) by mouth every 6 (six) hours as needed. 30 tablet 0  . dextromethorphan-guaiFENesin (MUCINEX DM) 30-600 MG 12hr tablet Take 1 tablet by mouth 2 (two) times daily. 20 tablet 0  . famotidine (PEPCID) 20 MG tablet Take 1 tablet (20 mg total) by mouth daily. (NEEDS TO BE SEEN BEFORE NEXT REFILL) 90 tablet 0  . fluticasone (FLONASE) 50 MCG/ACT nasal spray Place 1 spray into both nostrils 2 (two) times daily as needed for allergies or rhinitis. 16 g 6  . Hyoscyamine Sulfate SL (LEVSIN/SL) 0.125 MG SUBL Place 0.125 mg under the tongue every 6 (six) hours as needed. 30 tablet 5  . loratadine (CLARITIN) 10 MG tablet Take 10 mg by mouth daily.    Marland Kitchen omeprazole (PRILOSEC) 20 MG capsule Take 20 mg by mouth daily.    . ondansetron (ZOFRAN-ODT) 4 MG disintegrating tablet Take 1 tablet (4 mg total) by mouth every 6 (six) hours as  needed for nausea or vomiting. 20 tablet 1  . Rimegepant Sulfate (NURTEC) 75 MG TBDP Take 1 tablet by mouth daily as needed (migraine). 2 tablet 0  . SUMAtriptan (IMITREX) 100 MG tablet Take 100 mg by mouth as needed.     . valACYclovir (VALTREX) 500 MG tablet Take 500 mg by mouth daily.     No current facility-administered medications for this visit.    Physical Exam:     Ht '5\' 5"'  (1.651 m)   Wt 171 lb 6 oz (77.7 kg)    LMP  (LMP Unknown)   BMI 28.52 kg/m   GENERAL:  Pleasant female in NAD PSYCH: : Cooperative, normal affect ABDOMEN:  +Carnett's sign in RLQ.  Abdomen otherwise Nondistended, soft.  No other areas of tenderness. 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  Discussed diagnosis today with plan for conservative management with heating pad, rest.  If no significant improvement, she will schedule follow-up appointment for abdominal wall injection  2) Abdominal bloating - Check HLA DQ2/DQ8 to rule out Celiac Disease.  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  Repeat in 10 years (2031)  I spent 30 minutes of time, including in depth chart review, independent review of results as outlined above, communicating results with the patient directly, face-to-face time with the patient, coordinating care, and ordering studies and medications as appropriate, and documentation.          Sierra Pittman ,DO, FACG 09/22/2020, 1:49 PM

## 2020-10-01 LAB — CELIAC HLA RFLX TO ABS
DQ2 (DQA1 0501/0505,DQB1 02XX): NEGATIVE
DQ8 (DQA1 03XX, DQB1 0302): NEGATIVE

## 2020-12-18 ENCOUNTER — Other Ambulatory Visit: Payer: Self-pay | Admitting: Family Medicine

## 2021-01-17 ENCOUNTER — Other Ambulatory Visit: Payer: Self-pay | Admitting: Family Medicine

## 2021-01-17 NOTE — Telephone Encounter (Signed)
Gottschalk. NTBS 30 days given 12/19/20

## 2021-01-19 ENCOUNTER — Encounter: Payer: Self-pay | Admitting: Family Medicine

## 2021-01-19 ENCOUNTER — Ambulatory Visit (INDEPENDENT_AMBULATORY_CARE_PROVIDER_SITE_OTHER): Payer: BC Managed Care – PPO | Admitting: Family Medicine

## 2021-01-19 ENCOUNTER — Other Ambulatory Visit: Payer: Self-pay

## 2021-01-19 VITALS — BP 130/90 | HR 82 | Temp 97.8°F | Ht 65.0 in | Wt 171.4 lb

## 2021-01-19 DIAGNOSIS — G8929 Other chronic pain: Secondary | ICD-10-CM | POA: Diagnosis not present

## 2021-01-19 DIAGNOSIS — D696 Thrombocytopenia, unspecified: Secondary | ICD-10-CM | POA: Diagnosis not present

## 2021-01-19 DIAGNOSIS — Z0000 Encounter for general adult medical examination without abnormal findings: Secondary | ICD-10-CM

## 2021-01-19 DIAGNOSIS — M25551 Pain in right hip: Secondary | ICD-10-CM

## 2021-01-19 DIAGNOSIS — E7800 Pure hypercholesterolemia, unspecified: Secondary | ICD-10-CM

## 2021-01-19 DIAGNOSIS — K580 Irritable bowel syndrome with diarrhea: Secondary | ICD-10-CM

## 2021-01-19 DIAGNOSIS — Z0001 Encounter for general adult medical examination with abnormal findings: Secondary | ICD-10-CM

## 2021-01-19 DIAGNOSIS — Z1159 Encounter for screening for other viral diseases: Secondary | ICD-10-CM

## 2021-01-19 DIAGNOSIS — Z23 Encounter for immunization: Secondary | ICD-10-CM

## 2021-01-19 DIAGNOSIS — G43709 Chronic migraine without aura, not intractable, without status migrainosus: Secondary | ICD-10-CM

## 2021-01-19 MED ORDER — AMITRIPTYLINE HCL 10 MG PO TABS: 10.0000 mg | ORAL_TABLET | Freq: Every evening | ORAL | 3 refills | Status: DC | PRN

## 2021-01-19 NOTE — Patient Instructions (Signed)
Trial of amitriptyline: This is indicated in IBS diarrhea, migraine headaches and in chronic pain syndromes.  Use only 1 at bedtime to start with.  It should improve sleep as a side effect is sleepiness.  May advance it to if needed.  You had labs performed today.  You will be contacted with the results of the labs once they are available, usually in the next 3 business days for routine lab work.  If you have an active my chart account, they will be released to your MyChart.  If you prefer to have these labs released to you via telephone, please let us know.  If you had a pap smear or biopsy performed, expect to be contacted in about 7-10 days.  Preventive Care 52-53 Years Old, Female Preventive care refers to lifestyle choices and visits with your health care provider that can promote health and wellness. This includes: A yearly physical exam. This is also called an annual wellness visit. Regular dental and eye exams. Immunizations. Screening for certain conditions. Healthy lifestyle choices, such as: Eating a healthy diet. Getting regular exercise. Not using drugs or products that contain nicotine and tobacco. Limiting alcohol use. What can I expect for my preventive care visit? Physical exam Your health care provider will check your: Height and weight. These may be used to calculate your BMI (body mass index). BMI is a measurement that tells if you are at a healthy weight. Heart rate and blood pressure. Body temperature. Skin for abnormal spots. Counseling Your health care provider may ask you questions about your: Past medical problems. Family's medical history. Alcohol, tobacco, and drug use. Emotional well-being. Home life and relationship well-being. Sexual activity. Diet, exercise, and sleep habits. Work and work Statistician. Access to firearms. Method of birth control. Menstrual cycle. Pregnancy history. What immunizations do I need?  Vaccines are usually given at  various ages, according to a schedule. Your health care provider will recommend vaccines for you based on your age, medicalhistory, and lifestyle or other factors, such as travel or where you work. What tests do I need? Blood tests Lipid and cholesterol levels. These may be checked every 5 years, or more often if you are over 17 years old. Hepatitis C test. Hepatitis B test. Screening Lung cancer screening. You may have this screening every year starting at age 6 if you have a 30-pack-year history of smoking and currently smoke or have quit within the past 15 years. Colorectal cancer screening. All adults should have this screening starting at age 15 and continuing until age 50. Your health care provider may recommend screening at age 27 if you are at increased risk. You will have tests every 1-10 years, depending on your results and the type of screening test. Diabetes screening. This is done by checking your blood sugar (glucose) after you have not eaten for a while (fasting). You may have this done every 1-3 years. Mammogram. This may be done every 1-2 years. Talk with your health care provider about when you should start having regular mammograms. This may depend on whether you have a family history of breast cancer. BRCA-related cancer screening. This may be done if you have a family history of breast, ovarian, tubal, or peritoneal cancers. Pelvic exam and Pap test. This may be done every 3 years starting at age 31. Starting at age 22, this may be done every 5 years if you have a Pap test in combination with an HPV test. Other tests STD (sexually transmitted disease) testing,  if you are at risk. Bone density scan. This is done to screen for osteoporosis. You may have this scan if you are at high risk for osteoporosis. Talk with your health care provider about your test results, treatment options,and if necessary, the need for more tests. Follow these instructions at home: Eating and  drinking  Eat a diet that includes fresh fruits and vegetables, whole grains, lean protein, and low-fat dairy products. Take vitamin and mineral supplements as recommended by your health care provider. Do not drink alcohol if: Your health care provider tells you not to drink. You are pregnant, may be pregnant, or are planning to become pregnant. If you drink alcohol: Limit how much you have to 0-1 drink a day. Be aware of how much alcohol is in your drink. In the [place] one drink equals one 12 oz bottle of beer (355 mL), one 5 oz glass of wine (148 mL), or one 1 oz glass of hard liquor (44 mL).  Lifestyle Take daily care of your teeth and gums. Brush your teeth every morning and night with fluoride toothpaste. Floss one time each day. Stay active. Exercise for at least 30 minutes 5 or more days each week. Do not use any products that contain nicotine or tobacco, such as cigarettes, e-cigarettes, and chewing tobacco. If you need help quitting, ask your health care provider. Do not use drugs. If you are sexually active, practice safe sex. Use a condom or other form of protection to prevent STIs (sexually transmitted infections). If you do not wish to become pregnant, use a form of birth control. If you plan to become pregnant, see your health care provider for a prepregnancy visit. If told by your health care provider, take low-dose aspirin daily starting at age 2. Find healthy ways to cope with stress, such as: Meditation, yoga, or listening to music. Journaling. Talking to a trusted person. Spending time with friends and family. Safety Always wear your seat belt while driving or riding in a vehicle. Do not drive: If you have been drinking alcohol. Do not ride with someone who has been drinking. When you are tired or distracted. While texting. Wear a helmet and other protective equipment during sports activities. If you have firearms in your house, make sure you follow all gun safety  procedures. What's next? Visit your health care provider once a year for an annual wellness visit. Ask your health care provider how often you should have your eyes and teeth checked. Stay up to date on all vaccines. This information is not intended to replace advice given to you by your health care provider. Make sure you discuss any questions you have with your healthcare provider. Document Revised: 02/23/2020 Document Reviewed: 01/30/2018 Elsevier Patient Education  2022 [employer].

## 2021-01-19 NOTE — Progress Notes (Signed)
Sierra Pittman is a 50 y.o. female presents to office today for annual physical exam examination.    Concerns today include: 1.  Right low back/hip pain Patient reports that she has been struggling with some pain that radiates down the right lower extremity.  She not sure if this is coming from her back or hip but does note that it is a bothersome thing to be dealing with.  She is trying to stay physically active but sometimes this is limiting.  2.  Migraine headaches Patient reports that this waxes and wanes in severity.  Sometimes she will have consistent headaches but sometimes she will go several weeks without a headache.  She has Imitrex on board if needed.  Has never used a TCA to her knowledge for prevention  3.  IBS-D She continues to have intermittent bouts of diarrhea and abdominal pain with occasional nausea and vomiting.  She most recently ate something whilst on vacation and this really exacerbated her symptoms.  She still has some stomach sensitivity and notes overall symptoms are improving.  Does not report any blood in stool  Occupation: She is a Radio producer  Diet: Tries to stick with a gluten-free diet, Exercise: Stays physically active Last eye exam: Up-to-date Last dental exam: Up-to-date Last colonoscopy: UTD Last mammogram: UTD Last pap smear: UTD Refills needed today: None Immunizations needed: Td, Shingrix, Flu Immunization History  Administered Date(s) Administered   Influenza,inj,Quad PF,6+ Mos 03/05/2016   PFIZER(Purple Top)SARS-COV-2 Vaccination 08/09/2019, 08/30/2019   Td 01/11/2011   Tdap 01/11/2011     Past Medical History:  Diagnosis Date   Allergy    Anxiety    Arthritis    Colon polyps    COVID-19    Depression    Gestational diabetes    Hypertension    IBS (irritable bowel syndrome)    Pneumonia    Social History   Socioeconomic History   Marital status: Divorced    Spouse name: Not on file   Number of children: 2   Years of  education: Not on file   Highest education level: Not on file  Occupational History   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 Has 1 dog. Drinks 3 diet sodas a day. Exercises 3-5x a week at the gym. Has a bachelors degree. 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   Social Determinants of Health   Financial Resource Strain: Not on file  Food Insecurity: Not on file  Transportation Needs: Not on file  Physical Activity: Not on file  Stress: Not on file  Social Connections: Not on file  Intimate Partner Violence: Not on file   Past Surgical History:  Procedure Laterality Date   COLONOSCOPY  07/13/2019   Adelina Mings. Done in [place]   ESOPHAGOGASTRODUODENOSCOPY     Around 2016 in [place] as well. had a  hiatal henia a lot of acid and possibly h. pylori   Family History  Problem Relation Age of Onset   Hyperlipidemia Mother    Hypertension Father    Diabetes Father    Hyperlipidemia Father    Dementia Maternal Grandmother    Prostate cancer Maternal Grandfather    Hypertension Paternal Grandmother    Dementia Paternal Grandfather    Hyperlipidemia Brother    Colon polyps Brother    Hyperlipidemia Brother    Hyperlipidemia Brother    Colon cancer Neg Hx    Esophageal cancer Neg Hx     Current Outpatient Medications:    famotidine (PEPCID) 20 MG tablet, Take 1 tablet (20 mg total) by mouth daily. (NEEDS TO BE SEEN BEFORE NEXT REFILL), Disp: 30  tablet, Rfl: 0   Hyoscyamine Sulfate SL (LEVSIN/SL) 0.125 MG SUBL, Place 0.125 mg under the tongue every 6 (six) hours as needed. (Patient taking differently: Place 0.125 mg under the tongue as needed (once every 3 months).), Disp: 30 tablet, Rfl: 5   loratadine (CLARITIN) 10 MG tablet, Take 10 mg by mouth daily., Disp: , Rfl:    omeprazole (PRILOSEC) 20 MG capsule, Take 20 mg by mouth daily., Disp: , Rfl:    SUMAtriptan (IMITREX) 100 MG tablet, Take 100 mg by mouth as needed. , Disp: , Rfl:    valACYclovir (VALTREX) 500 MG tablet, Take 500 mg by mouth daily., Disp: , Rfl:   Allergies  Allergen Reactions   Amoxicillin Rash   Histamine Nausea And Vomiting   Penicillins Rash     ROS: Review of Systems Pertinent items noted in HPI and remainder of comprehensive ROS otherwise negative.    Physical exam BP 130/90   Pulse 82   Temp 97.8 F (36.6 C)   Ht '5\' 5"'  (1.651 m)   Wt 171 lb 6.4 oz (77.7 kg)   LMP  (LMP Unknown)   SpO2 95%   BMI 28.52 kg/m  General appearance: alert, cooperative, appears stated age, and no distress Head: Normocephalic, without obvious abnormality, atraumatic Eyes: negative findings: lids and lashes normal, conjunctivae and sclerae normal, corneas clear, and pupils equal, round, reactive to light and accomodation Ears: normal TM's and external ear canals both ears Nose: Nares normal. Septum midline. Mucosa normal. No drainage or sinus tenderness. Throat: normal findings: lips normal without lesions, buccal mucosa normal, and soft palate, uvula, and tonsils normal Neck: no adenopathy, supple, symmetrical, trachea midline, and thyroid not enlarged, symmetric, no tenderness/mass/nodules Back: symmetric, no curvature. ROM normal. No CVA tenderness. Lungs: clear to auscultation bilaterally Heart: regular rate and rhythm, S1, S2 normal, no murmur, click, rub or gallop Abdomen: soft, non-tender; bowel sounds normal; no masses,  no organomegaly Extremities: extremities  normal, atraumatic, no cyanosis or edema Pulses: 2+ and symmetric Skin: Skin color, texture, turgor normal. No rashes or lesions Lymph nodes: Cervical, supraclavicular, and axillary nodes normal. Neurologic: Alert and oriented X 3, normal strength and tone. Normal symmetric reflexes. Normal coordination and gait Right Hip: Negative FABER.  Negative FADIR.  Strength intact   Assessment/ Plan: Langston Reusing here for annual physical exam.   Annual physical exam  Pure hypercholesterolemia - Plan: Lipid panel, TSH, CMP14+EGFR, CMP14+EGFR, TSH, Lipid panel  Encounter for hepatitis C screening test for low risk patient - Plan: Hepatitis C antibody, Hepatitis C antibody  Decreased platelet count (HCC) - Plan: CBC with Differential/Platelet, CBC with Differential/Platelet  Chronic pain of right hip  Chronic migraine without aura without status migrainosus, not intractable -  Plan: amitriptyline (ELAVIL) 10 MG tablet  Irritable bowel syndrome with diarrhea - Plan: amitriptyline (ELAVIL) 10 MG tablet  Check fasting labs  Screen for hepatitis C obtained as I cannot find previous evidence that this had been collected  Noted to have decreased platelet count in past.  Recheck CBC  I wonder if she is having a flexor tendinopathy.  Differential diagnosis includes referred from back pain.  Given her recent bout of IBS, history of chronic migraines will trial TCA.  Discussed possible side effects of this medication.  She will follow-up here on this issue   Adolphus Hanf M. Lajuana Ripple, DO

## 2021-01-20 LAB — CBC WITH DIFFERENTIAL/PLATELET
Basophils Absolute: 0.1 10*3/uL (ref 0.0–0.2)
Basos: 1 %
EOS (ABSOLUTE): 0.1 10*3/uL (ref 0.0–0.4)
Eos: 1 %
Hematocrit: 39.8 % (ref 34.0–46.6)
Hemoglobin: 13.6 g/dL (ref 11.1–15.9)
Immature Grans (Abs): 0 10*3/uL (ref 0.0–0.1)
Immature Granulocytes: 0 %
Lymphocytes Absolute: 1.7 10*3/uL (ref 0.7–3.1)
Lymphs: 34 %
MCH: 30.4 pg (ref 26.6–33.0)
MCHC: 34.2 g/dL (ref 31.5–35.7)
MCV: 89 fL (ref 79–97)
Monocytes Absolute: 0.3 10*3/uL (ref 0.1–0.9)
Monocytes: 6 %
Neutrophils Absolute: 2.8 10*3/uL (ref 1.4–7.0)
Neutrophils: 58 %
Platelets: 167 10*3/uL (ref 150–450)
RBC: 4.47 x10E6/uL (ref 3.77–5.28)
RDW: 12.5 % (ref 11.7–15.4)
WBC: 4.9 10*3/uL (ref 3.4–10.8)

## 2021-01-20 LAB — CMP14+EGFR
ALT: 22 IU/L (ref 0–32)
AST: 26 IU/L (ref 0–40)
Albumin/Globulin Ratio: 1.6 (ref 1.2–2.2)
Albumin: 4.4 g/dL (ref 3.8–4.9)
Alkaline Phosphatase: 92 IU/L (ref 44–121)
BUN/Creatinine Ratio: 14 (ref 9–23)
BUN: 10 mg/dL (ref 6–24)
Bilirubin Total: 0.9 mg/dL (ref 0.0–1.2)
CO2: 26 mmol/L (ref 20–29)
Calcium: 9.3 mg/dL (ref 8.7–10.2)
Chloride: 101 mmol/L (ref 96–106)
Creatinine, Ser: 0.72 mg/dL (ref 0.57–1.00)
Globulin, Total: 2.7 g/dL (ref 1.5–4.5)
Glucose: 88 mg/dL (ref 65–99)
Potassium: 3.6 mmol/L (ref 3.5–5.2)
Sodium: 139 mmol/L (ref 134–144)
Total Protein: 7.1 g/dL (ref 6.0–8.5)
eGFR: 101 mL/min/{1.73_m2} (ref >59)

## 2021-01-20 LAB — TSH: TSH: 1.45 u[IU]/mL (ref 0.450–4.500)

## 2021-01-20 LAB — LIPID PANEL
Chol/HDL Ratio: 3.9 ratio (ref 0.0–4.4)
Cholesterol, Total: 218 mg/dL — ABNORMAL HIGH (ref 100–199)
HDL: 56 mg/dL (ref >39)
LDL Chol Calc (NIH): 130 mg/dL — ABNORMAL HIGH (ref 0–99)
Triglycerides: 180 mg/dL — ABNORMAL HIGH (ref 0–149)
VLDL Cholesterol Cal: 32 mg/dL (ref 5–40)

## 2021-01-20 LAB — HEPATITIS C ANTIBODY: Hep C Virus Ab: <0.1 s/co ratio (ref 0.0–0.9)

## 2021-06-08 ENCOUNTER — Telehealth: Payer: BC Managed Care – PPO | Admitting: Family

## 2021-06-08 ENCOUNTER — Ambulatory Visit (INDEPENDENT_AMBULATORY_CARE_PROVIDER_SITE_OTHER): Payer: BC Managed Care – PPO | Admitting: Family

## 2021-06-08 ENCOUNTER — Encounter: Payer: Self-pay | Admitting: Family

## 2021-06-08 VITALS — BP 128/80 | HR 79 | Temp 98.1°F | Ht 65.0 in | Wt 177.0 lb

## 2021-06-08 DIAGNOSIS — S0990XA Unspecified injury of head, initial encounter: Secondary | ICD-10-CM

## 2021-06-08 DIAGNOSIS — F0781 Postconcussional syndrome: Secondary | ICD-10-CM

## 2021-06-08 DIAGNOSIS — T148XXA Other injury of unspecified body region, initial encounter: Secondary | ICD-10-CM

## 2021-06-08 NOTE — Progress Notes (Signed)
Subjective:    Patient ID: Sierra Pittman, female    DOB: 1970/09/02, 51 y.o.   MRN: [id number]  Chief Complaint  Patient presents with   Head Injury    Hit head last week    Pt presents to the office today after a head injury. She reports last Thursday she was walking up a hill and fell and hit the back of her head. She did not lose conscienceless. He was having mild aching head pain and placed on ice on the area.   She reports she continues to have tenderness if she lays on area or touches it.    Denies any vision, gait, speech, memory, nausea or vomiting, or headache at this time.  Head Injury  The incident occurred 5 to 7 days ago. The injury mechanism was a fall. There was no loss of consciousness. There was no blood loss. The quality of the pain is described as throbbing, shooting and aching. The pain is at a severity of 3/10 (when something touches it is a 10 out 10). The pain is moderate. The pain has been fluctuating since the injury. Associated symptoms include headaches. Pertinent negatives include no blurred vision, disorientation, memory loss, numbness, vomiting or weakness. Associated symptoms comments: Dizziness a day after, but has resolved. . She has tried ice for the symptoms. The treatment provided mild relief.     Review of Systems  Eyes:  Negative for blurred vision.  Gastrointestinal:  Negative for vomiting.  Neurological:  Positive for headaches. Negative for weakness and numbness.  Psychiatric/Behavioral:  Negative for memory loss.   All other systems reviewed and are negative.     Objective:   Physical Exam Vitals reviewed.  Constitutional:      General: She is not in acute distress.    Appearance: She is well-developed.  HENT:     Head: Normocephalic and atraumatic.     Right Ear: Tympanic membrane normal.     Left Ear: Tympanic membrane normal.  Eyes:     Pupils: Pupils are equal, round, and reactive to light.  Neck:     Thyroid: No thyromegaly.   Cardiovascular:     Rate and Rhythm: Normal rate and regular rhythm.     Heart sounds: Normal heart sounds. No murmur heard. Pulmonary:     Effort: Pulmonary effort is normal. No respiratory distress.     Breath sounds: Normal breath sounds. No wheezing.  Abdominal:     General: Bowel sounds are normal. There is no distension.     Palpations: Abdomen is soft.     Tenderness: There is no abdominal tenderness.  Musculoskeletal:        General: No tenderness. Normal range of motion.     Cervical back: Normal range of motion and neck supple.  Skin:    General: Skin is warm and dry.  Neurological:     Mental Status: She is alert and oriented to person, place, and time.     Cranial Nerves: No cranial nerve deficit.     Deep Tendon Reflexes: Reflexes are normal and symmetric.  Psychiatric:        Behavior: Behavior normal.        Thought Content: Thought content normal.        Judgment: Judgment normal.         BP 128/80    Pulse 79    Temp 98.1 F (36.7 C) (Temporal)    Ht 5\' 5"  (1.651 m)    Wt 177  lb (80.3 kg)    LMP  (LMP Unknown)    BMI 29.45 kg/m   Assessment & Plan:  Foy Mungia comes in today with chief complaint of Head Injury (Hit head last week )   Diagnosis and orders addressed:  1. Injury of head, initial encounter  2. Post concussion syndrome  3. Hematoma   Neuro exam normal today Discussed in length red flags, is she is out of window of worrisome brain bleed. No critical symptoms observed today.  Rest Brain rest, avoid over stimulation  Follow up if symptoms worsen or do not improve  Approx 26 mins spent with patient discussing care, education, charting, and reviewing.   Jannifer Rodney, FNP

## 2021-06-08 NOTE — Patient Instructions (Signed)
Hematoma A hematoma is a collection of blood under the skin, in an organ, in a body space, in a joint space, or in other tissue. The blood can thicken (clot) to form a lump that you can see and feel. The lump is often firm and may become sore and tender. Most hematomas get better in a few days to weeks. However, some hematomas may be serious and require medical care. Hematomas can range from very small to very large. What are the causes? This condition is caused by: A blunt or penetrating injury. A leakage from a blood vessel under the skin. Some medical procedures, including surgeries, such as oral surgery, face lifts, and surgeries on the joints. Some medical conditions that cause bleeding or bruising. There may be multiple hematomas that appear in different areas of the body. What increases the risk? You are more likely to develop this condition if: You are an older adult. You use blood thinners. What are the signs or symptoms? Symptoms of this condition depend on where the hematoma is located.  Common symptoms of a hematoma that is under the skin include: A firm lump on the body. Pain and tenderness in the area. Bruising. Blue, dark blue, purple-red, or yellowish skin (discoloration) may appear at the site of the hematoma if the hematoma is close to the surface of the skin. Common symptoms of a hematoma that is deep in the tissues or body spaces may be less obvious. They include: A collection of blood in the stomach (intra-abdominal hematoma). This may cause pain in the abdomen, weakness, fainting, and shortness of breath. A collection of blood in the head (intracranial hematoma). This may cause a headache or symptoms such as weakness, trouble speaking or understanding, or a change in consciousness.  How is this diagnosed? This condition is diagnosed based on: Your medical history. A physical exam. Imaging tests, such as an ultrasound or CT scan. These may be needed if your health care  provider suspects a hematoma in deeper tissues or body spaces. Blood tests. These may be needed if your health care provider believes that the hematoma is caused by a medical condition. How is this treated? Treatment for this condition depends on the cause, size, and location of the hematoma. Treatment may include: Doing nothing. The majority of hematomas do not need treatment as many of them go away on their own over time. Surgery or close monitoring. This may be needed for large hematomas or hematomas that affect vital organs. Medicines. Medicines may be given if there is an underlying medical cause for the hematoma. Follow these instructions at home: Managing pain, stiffness, and swelling  If directed, put ice on the affected area. Put ice in a plastic bag. Place a towel between your skin and the bag. Leave the ice on for 20 minutes, 2-3 times a day for the first couple of days. If directed, apply heat to the affected area after applying ice for a couple of days. Use the heat source that your health care provider recommends, such as a moist heat pack or a heating pad. Place a towel between your skin and the heat source. Leave the heat on for 20-30 minutes. Remove the heat if your skin turns bright red. This is especially important if you are unable to feel pain, heat, or cold. You may have a greater risk of getting burned. Raise (elevate) the affected area above the level of your heart while you are sitting or lying down. If told, wrap the   affected area with an elastic bandage. The bandage applies pressure (compression) to the area, which may help to reduce swelling and promote healing. Do not wrap the bandage too tightly around the affected area. If your hematoma is on a leg or foot (lower extremity) and is painful, your health care provider may recommend crutches. Use them as told by your health care provider. General instructions Take over-the-counter and prescription medicines only as  told by your health care provider. Keep all follow-up visits as told by your health care provider. This is important. Contact a health care provider if: You have a fever. The swelling or discoloration gets worse. You develop more hematomas. Get help right away if: Your pain is worse or your pain is not controlled with medicine. Your skin over the hematoma breaks or starts bleeding. Your hematoma is in your chest or abdomen and you have weakness, shortness of breath, or a change in consciousness. You have a hematoma on your scalp that is caused by a fall or injury, and you also have: A headache that gets worse. Trouble speaking or understanding speech. Weakness. Change in alertness or consciousness. Summary A hematoma is a collection of blood under the skin, in an organ, in a body space, in a joint space, or in other tissue. This condition usually does not need treatment because many hematomas go away on their own over time. Large hematomas, or those that may affect vital organs, may need surgical drainage or monitoring. If the hematoma is caused by a medical condition, medicines may be prescribed. Get help right away if your hematoma breaks or starts to bleed, you have shortness of breath, or you have a headache or trouble speaking after a fall. This information is not intended to replace advice given to you by your health care provider. Make sure you discuss any questions you have with your health care provider. Document Revised: 10/15/2018 Document Reviewed: 10/24/2017 Elsevier Patient Education  2022 [employer]  

## 2021-08-15 ENCOUNTER — Telehealth: Payer: Self-pay | Admitting: Family Medicine

## 2021-08-15 NOTE — Telephone Encounter (Signed)
Pt advised she does need 2nd Shingrix and scheduled her with the Triage Nurse 08/25/21 at 11:30. ?

## 2021-08-25 ENCOUNTER — Ambulatory Visit: Payer: BC Managed Care – PPO

## 2021-09-07 ENCOUNTER — Encounter: Payer: Self-pay | Admitting: Family Medicine

## 2021-09-07 ENCOUNTER — Ambulatory Visit (INDEPENDENT_AMBULATORY_CARE_PROVIDER_SITE_OTHER): Payer: BC Managed Care – PPO

## 2021-09-07 ENCOUNTER — Ambulatory Visit (INDEPENDENT_AMBULATORY_CARE_PROVIDER_SITE_OTHER): Payer: BC Managed Care – PPO | Admitting: Family Medicine

## 2021-09-07 ENCOUNTER — Ambulatory Visit: Payer: BC Managed Care – PPO

## 2021-09-07 DIAGNOSIS — M940 Chondrocostal junction syndrome [Tietze]: Secondary | ICD-10-CM | POA: Diagnosis not present

## 2021-09-07 DIAGNOSIS — R0989 Other specified symptoms and signs involving the circulatory and respiratory systems: Secondary | ICD-10-CM

## 2021-09-07 DIAGNOSIS — Z8701 Personal history of pneumonia (recurrent): Secondary | ICD-10-CM | POA: Diagnosis not present

## 2021-09-07 DIAGNOSIS — J069 Acute upper respiratory infection, unspecified: Secondary | ICD-10-CM

## 2021-09-07 MED ORDER — DOXYCYCLINE HYCLATE 100 MG PO TABS: 100.0000 mg | ORAL_TABLET | Freq: Two times a day (BID) | ORAL | 0 refills | Status: AC

## 2021-09-07 MED ORDER — PREDNISONE 20 MG PO TABS: 20.0000 mg | ORAL_TABLET | Freq: Every day | ORAL | 0 refills | Status: AC

## 2021-09-07 NOTE — Progress Notes (Signed)
? ?Virtual Visit  Note ?Due to COVID-19 pandemic this visit was conducted virtually. This visit type was conducted due to national recommendations for restrictions regarding the COVID-19 Pandemic (e.g. social distancing, sheltering in place) in an effort to limit this patient's exposure and mitigate transmission in our community. All issues noted in this document were discussed and addressed.  A physical exam was not performed with this format. ? ?I connected with Sierra Pittman on 09/07/21 at 1306 by telephone and verified that I am speaking with the correct person using two identifiers. Sierra Pittman is currently located at home and no one is currently with her during the visit. The provider, Gwenlyn Perking, FNP is located in their office at time of visit. ? ?I discussed the limitations, risks, security and privacy concerns of performing an evaluation and management service by telephone and the availability of in person appointments. I also discussed with the patient that there may be a patient responsible charge related to this service. The patient expressed understanding and agreed to proceed. ? ?CC: fever ? ?History and Present Illness: ? ?HPI ?Sierra Pittman reports cough and congestion x 5 days. She also reports a fever for 4 days with max temp of 101. She is now having congestion in her chest and her cough is productive. She reports chills, mild sore throat, and fatigue. She has some pain in her chest under her left breast with coughing or deep breathing. She has lost her smell and taste. She is concerned about pneumonia has she had this last year after Covid. She has had a negative home Covid test x 2. She has been taking OTC cough medications with some improvement. She denies wheezing or shortness of breath.  ? ?ROS ?As per HPI.  ? ?Observations/Objective: ?Alert and oriented x 3. Able to speak in full sentences without difficulty.  ? ? ?Assessment and Plan: ?Sierra Pittman was seen today for fever. ? ?Diagnoses and all  orders for this visit: ? ?Upper respiratory tract infection, unspecified type ?-     DG Chest 2 View; Future ?-     predniSONE (DELTASONE) 20 MG tablet; Take 1 tablet (20 mg total) by mouth daily with breakfast for 3 days. ? ?Chest congestion ?-     DG Chest 2 View; Future ? ?Costochondritis ?-     doxycycline (VIBRA-TABS) 100 MG tablet; Take 1 tablet (100 mg total) by mouth 2 (two) times daily for 7 days. 1 po bid ? ?Hx of bacterial pneumonia ?-     DG Chest 2 View; Future ? ? ?CXR negative today. Discussed likely viral etiology, however, I have sent in doxycyline for potential secondary bacterial sinusitis if symptoms do not improve or worsen over the long weekend. Prednisone burst for costochondritis. OTC decongestants, nasal saline, tylenol/advil as needed.  ? ?Follow Up Instructions: ?Return to office for new or worsening symptoms, or if symptoms persist.  ? ?  ?I discussed the assessment and treatment plan with the patient. The patient was provided an opportunity to ask questions and all were answered. The patient agreed with the plan and demonstrated an understanding of the instructions. ?  ?The patient was advised to call back or seek an in-person evaluation if the symptoms worsen or if the condition fails to improve as anticipated. ? ?The above assessment and management plan was discussed with the patient. The patient verbalized understanding of and has agreed to the management plan. Patient is aware to call the clinic if symptoms persist or worsen. Patient is aware  when to return to the clinic for a follow-up visit. Patient educated on when it is appropriate to go to the emergency department.  ? ?Time call ended:  1318 ? ?I provided 12 minutes of  non face-to-face time during this encounter. ? ? ? ?Gwenlyn Perking, FNP ? ? ?

## 2021-09-11 ENCOUNTER — Ambulatory Visit (INDEPENDENT_AMBULATORY_CARE_PROVIDER_SITE_OTHER): Payer: BC Managed Care – PPO | Admitting: Family Medicine

## 2021-09-11 DIAGNOSIS — Z23 Encounter for immunization: Secondary | ICD-10-CM

## 2021-09-11 NOTE — Progress Notes (Signed)
Patient given second shingrix and tolerated well

## 2021-10-22 IMAGING — US US ABDOMEN LIMITED RUQ/ASCITES
1 series · 14 of 25 positions shown · non-contrast
Comparison: None.

CLINICAL DATA: Right upper quadrant pain.  Elevated LFTs

EXAM:
ULTRASOUND ABDOMEN LIMITED RIGHT UPPER QUADRANT

[Series 1: us abdomen limited ruq (liver/gb) · 14 of 68 slices shown]
[im 1/68]
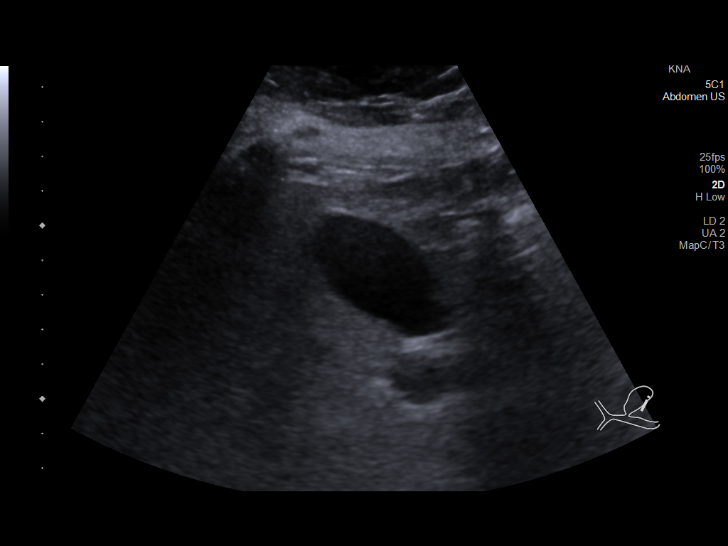
[im 6/68]
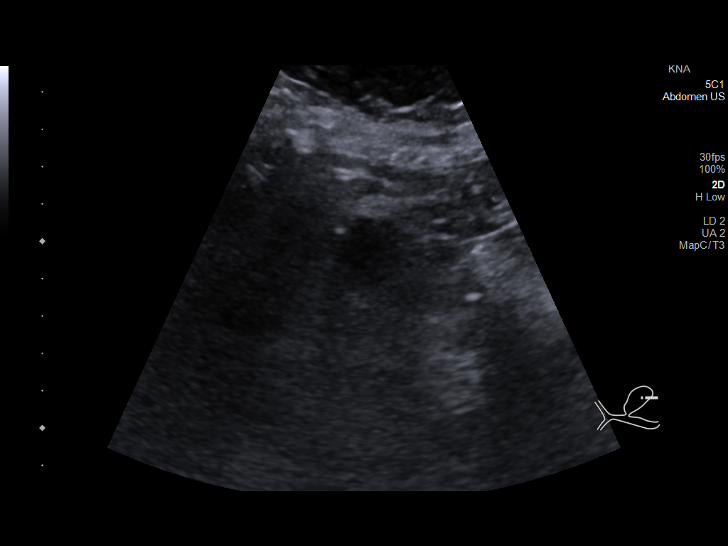
[im 12/68]
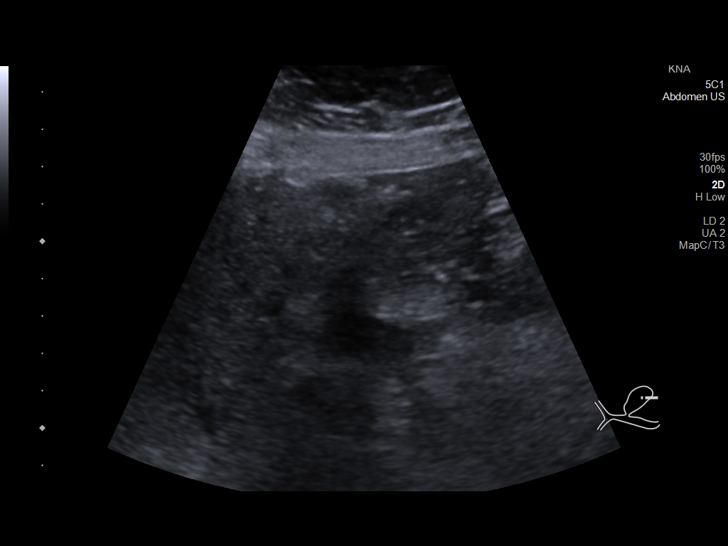
[im 17/68]
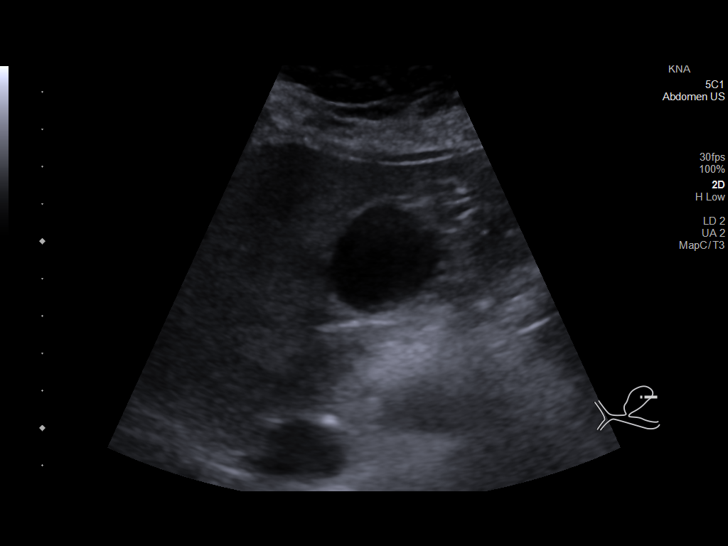
[im 23/68]
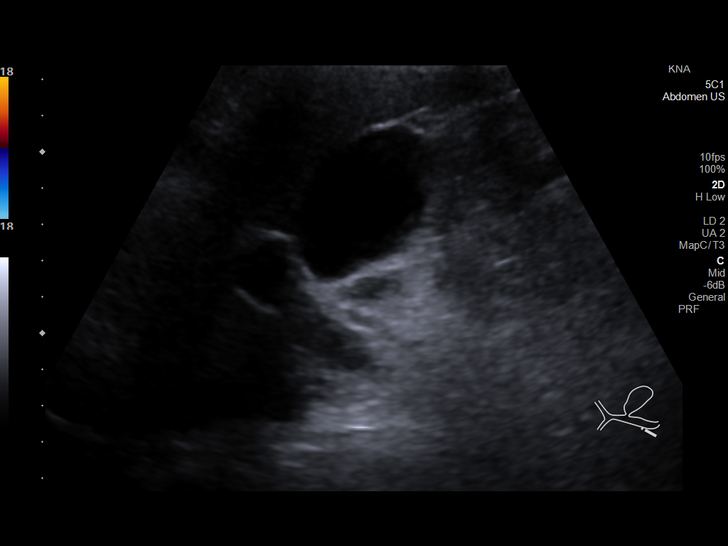
[im 26/68]
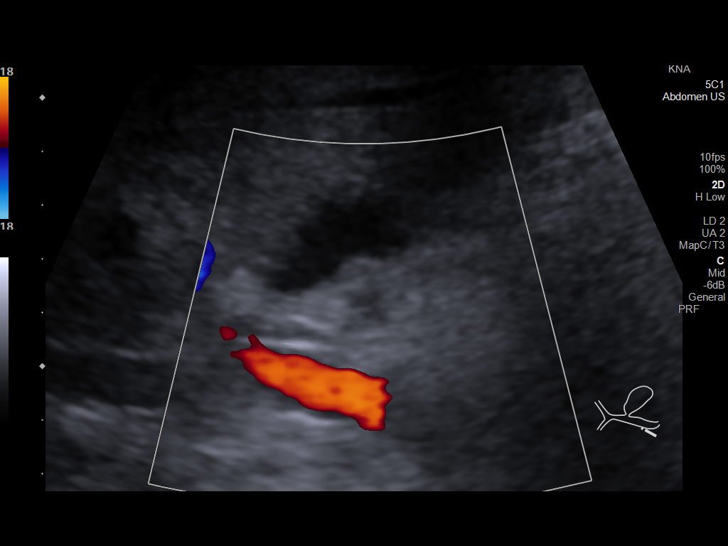
[im 31/68]
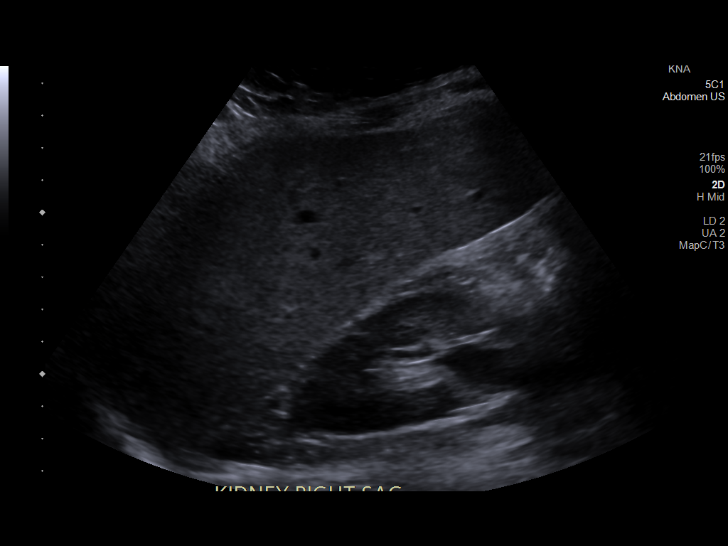
[im 37/68]
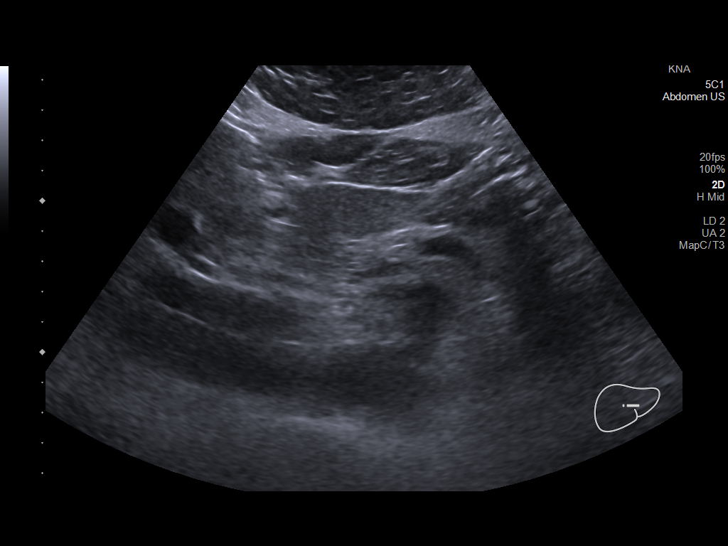
[im 42/68]
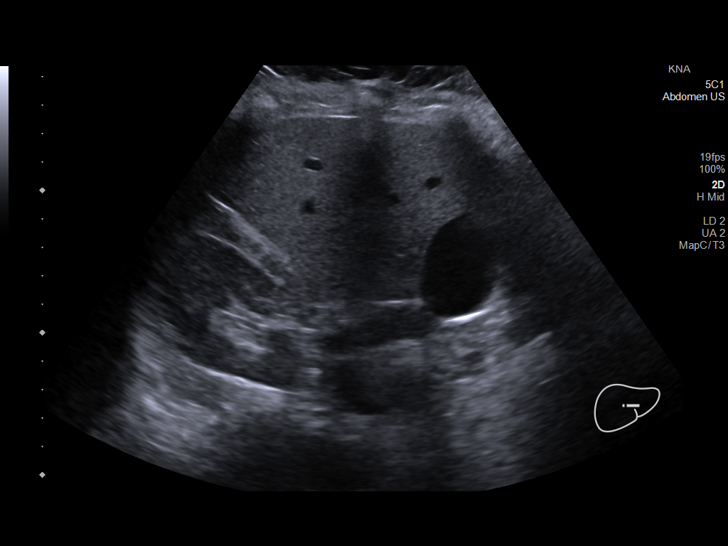
[im 45/68]
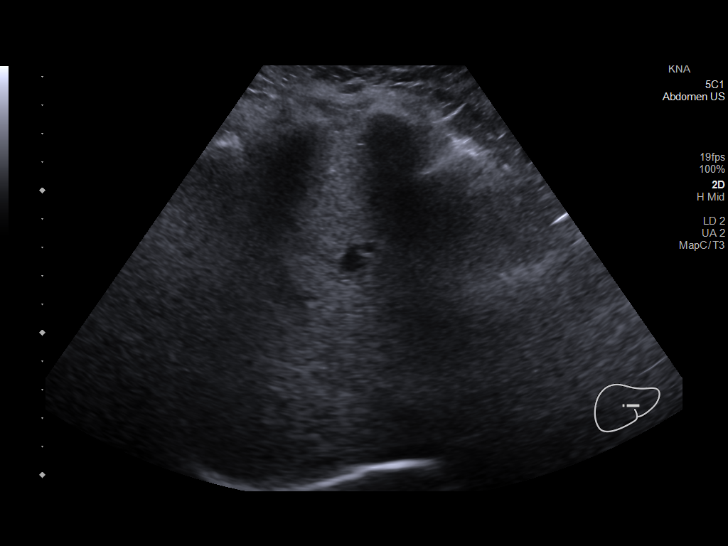
[im 51/68]
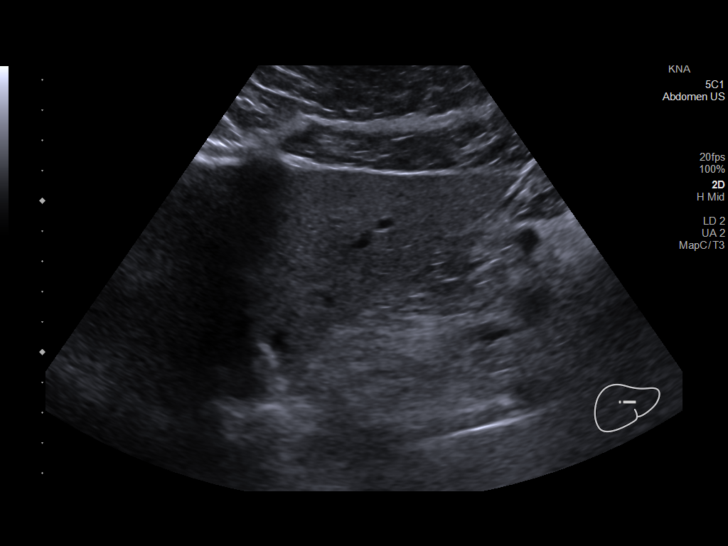
[im 56/68]
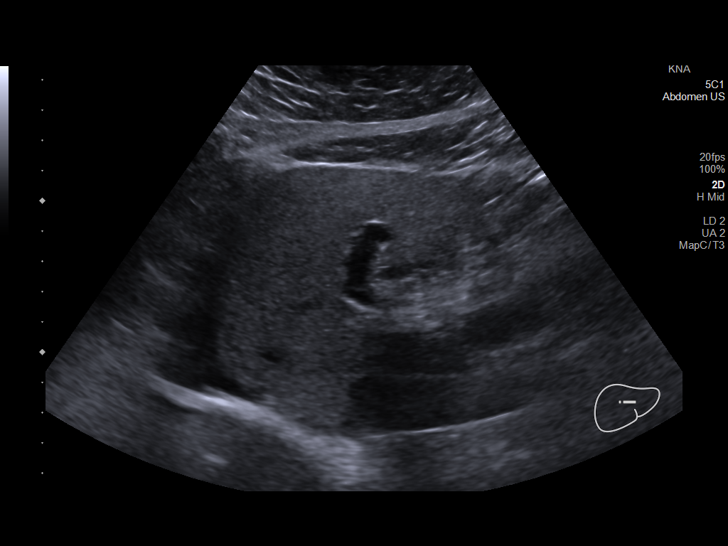
[im 62/68]
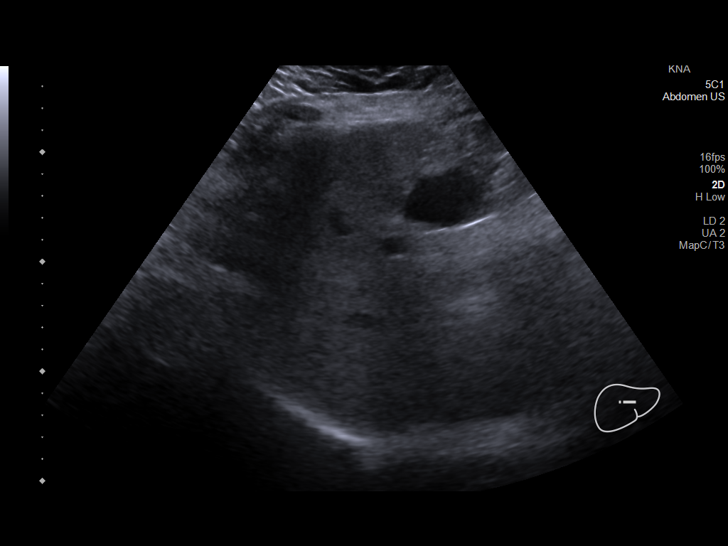
[im 68/68]
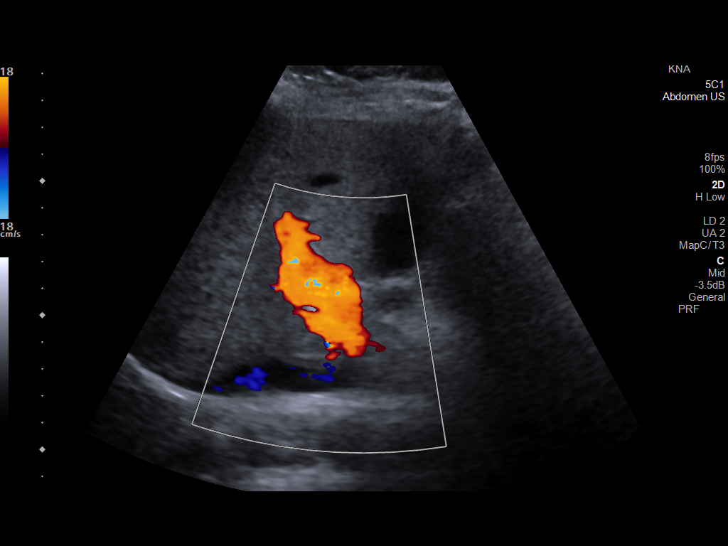

[14 of 25 positions shown; findings below may reference images not displayed]

FINDINGS: Gallbladder:

No gallstones or wall thickening visualized. No sonographic Murphy
sign noted by sonographer.

Common bile duct:

Diameter: 0.5 cm, within normal limits

Liver:

No focal lesion identified. Mild diffuse increase in liver
parenchymal echogenicity. Portal vein is patent on color Doppler
imaging with normal direction of blood flow towards the liver.

Other: Questionable mild right kidney hydronephrosis.
IMPRESSION: 1. Mild diffuse increase in liver parenchymal echogenicity which is
nonspecific but most commonly seen with hepatic steatosis. No focal
liver lesion identified.

2. Questionable mild right renal hydronephrosis. This is
incompletely evaluated on this exam. This could be further evaluated
with dedicated renal ultrasound or cross-sectional imaging.

## 2021-11-06 IMAGING — DX DG CHEST 2V
2 series · 2 of 2 positions shown · non-contrast
Comparison: 07/03/2020

CLINICAL DATA: Follow-up abnormal chest radiograph

EXAM:
CHEST - 2 VIEW

[chest pa]
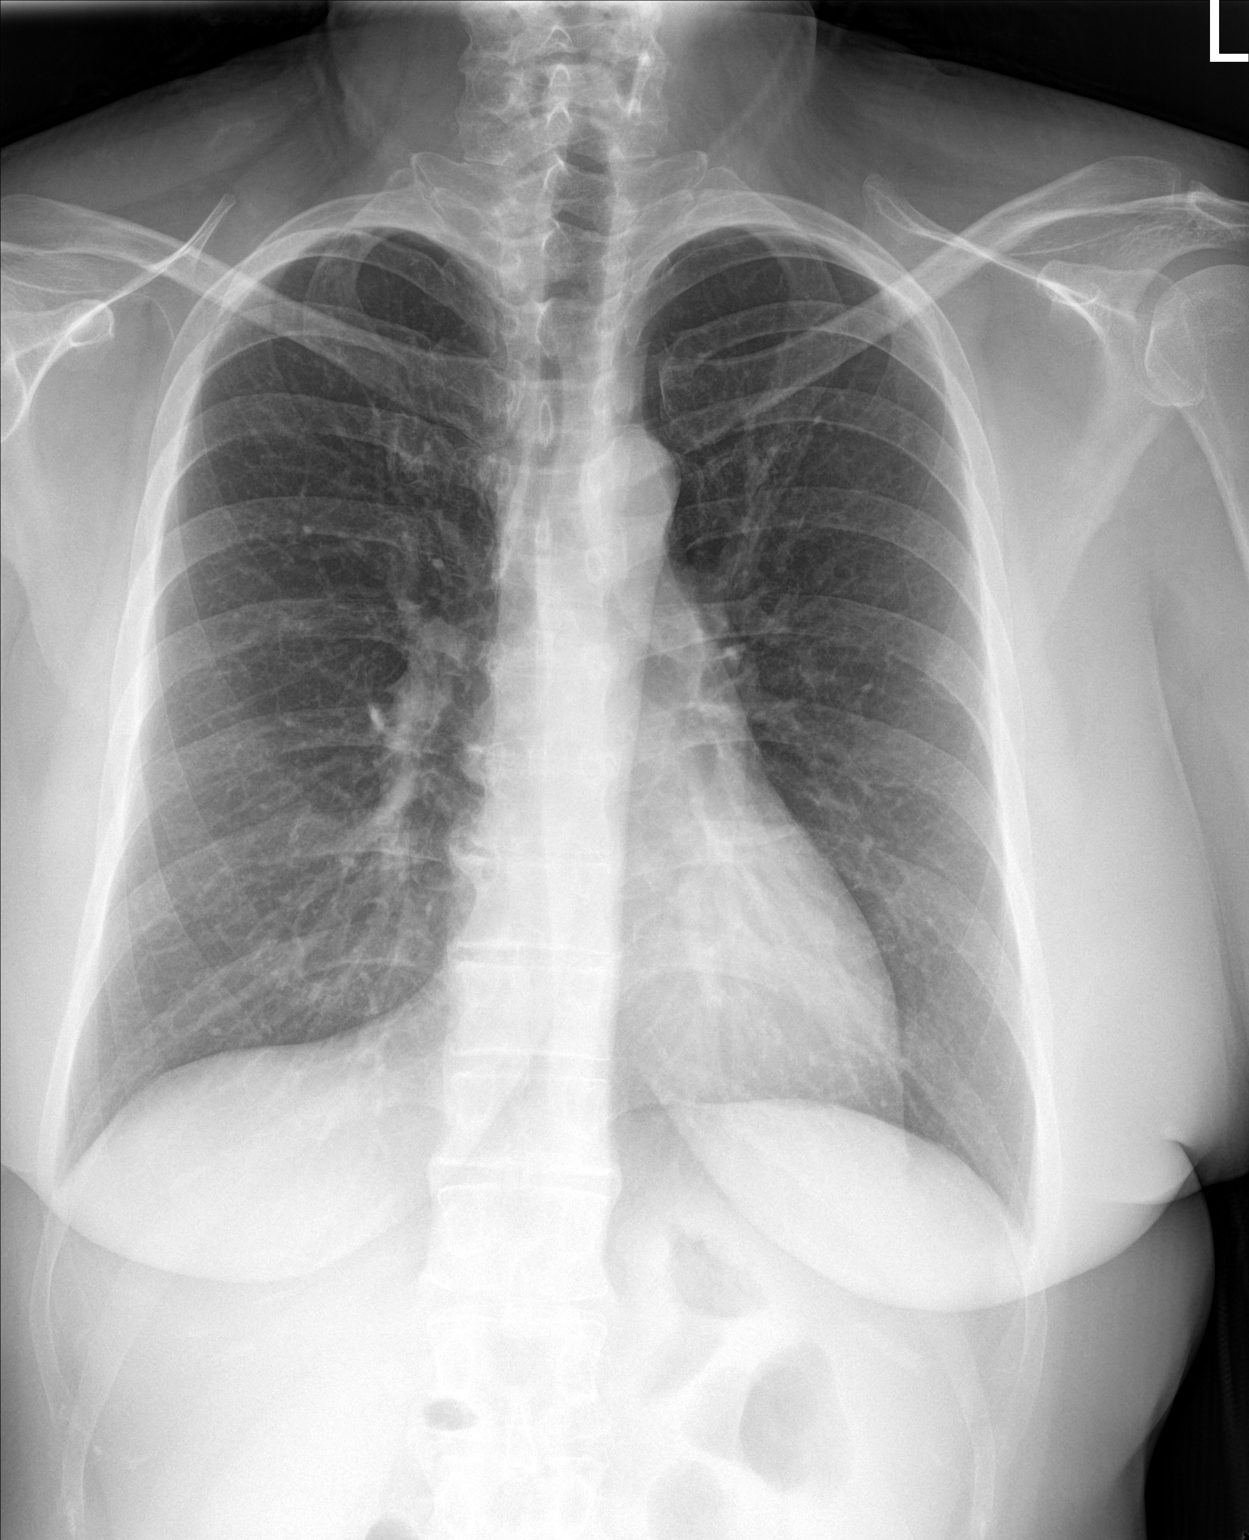

[chest lat]
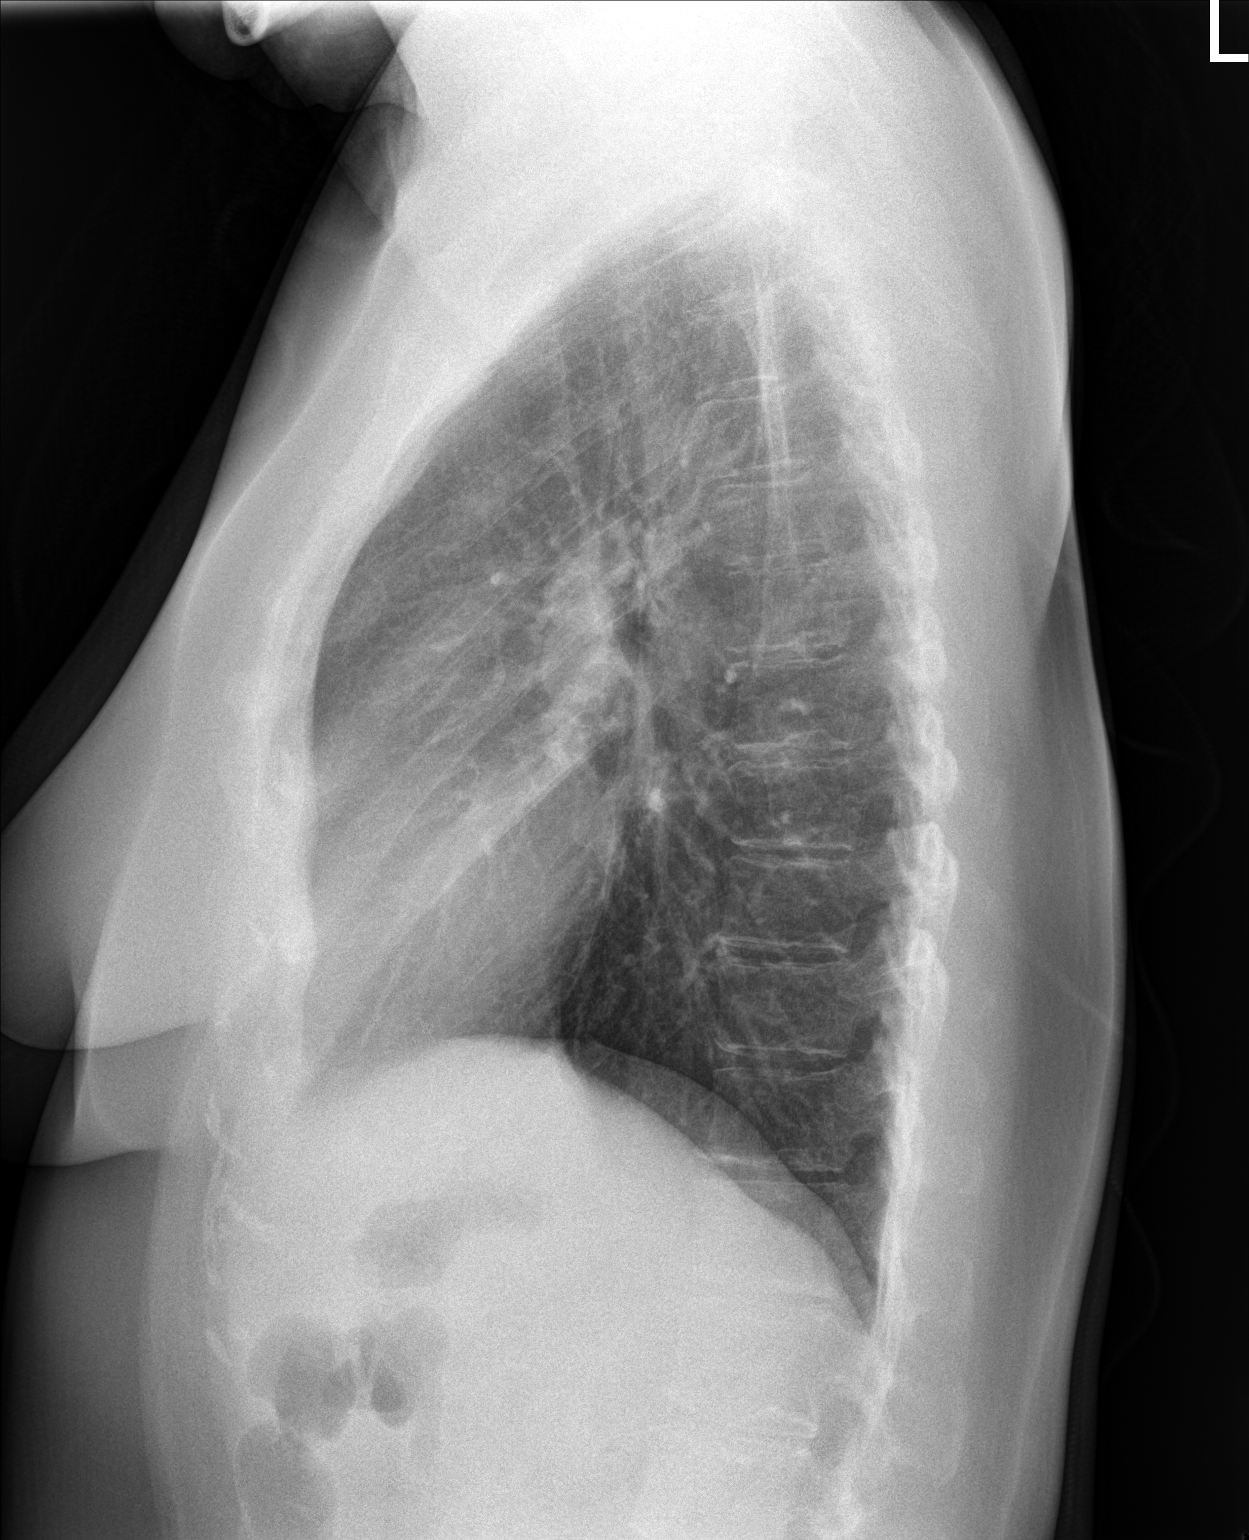

[2 of 2 positions shown; findings below may reference images not displayed]

FINDINGS: Lungs are clear.  No pleural effusion or pneumothorax.

The heart is normal in size.

Visualized osseous structures are within normal limits.
IMPRESSION: Normal chest radiographs.

## 2021-11-06 IMAGING — US US RENAL
1 series · 14 of 25 positions shown · non-contrast
Comparison: Right upper quadrant ultrasound dated 07/27/2020

CLINICAL DATA: Questionable right hydronephrosis on prior right
upper quadrant ultrasound

EXAM:
RENAL / URINARY TRACT ULTRASOUND COMPLETE

[Series 1: us renal · 14 of 47 slices shown]
[im 1/47]
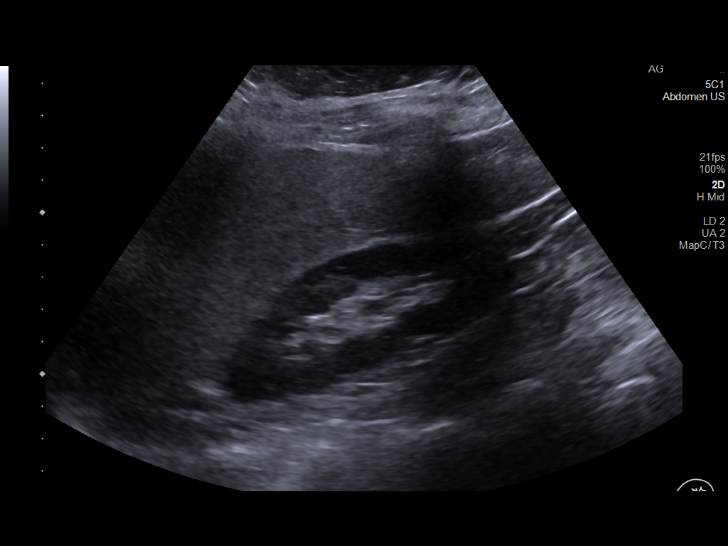
[im 4/47]
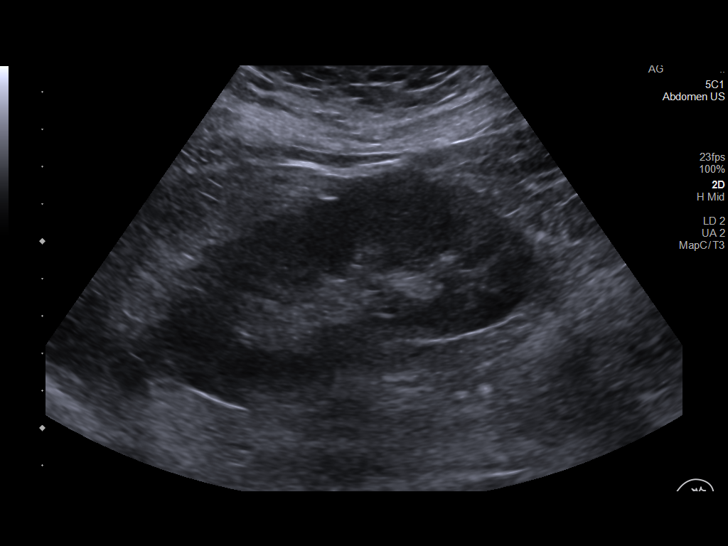
[im 8/47]
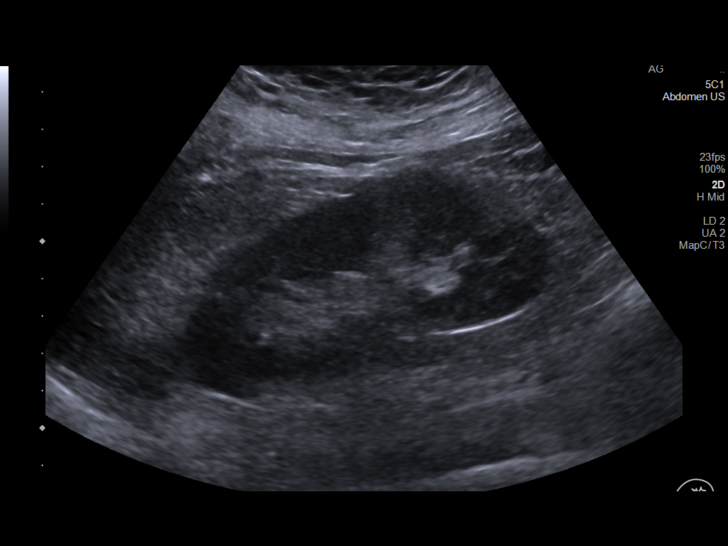
[im 12/47]
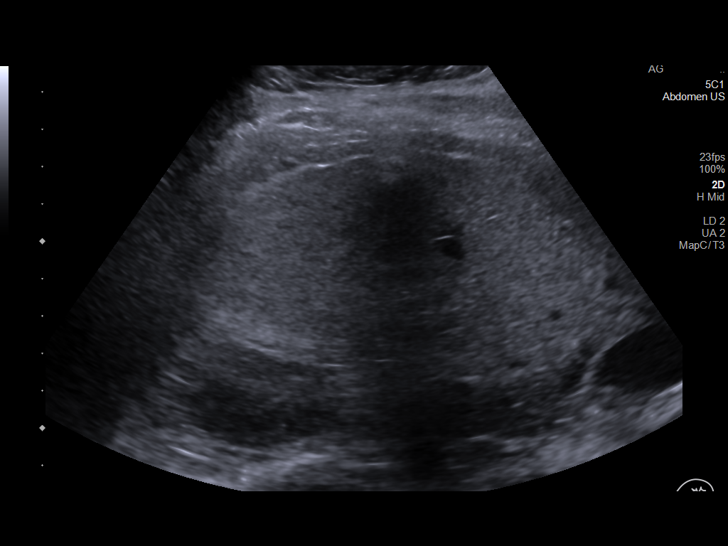
[im 16/47]
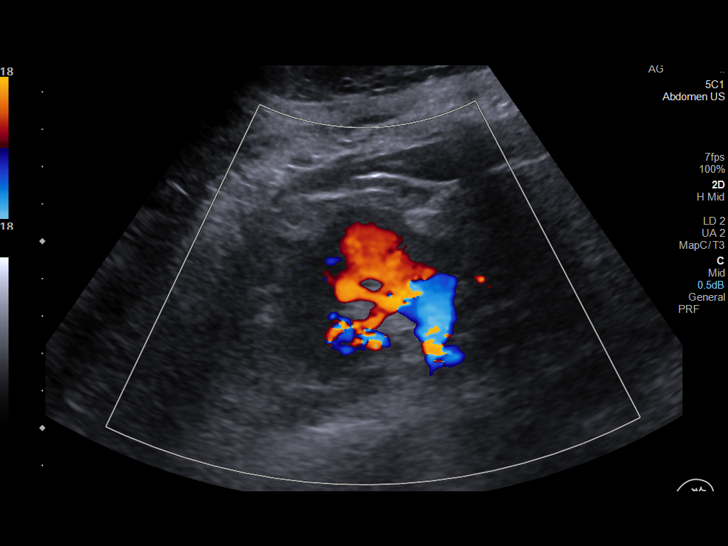
[im 18/47]
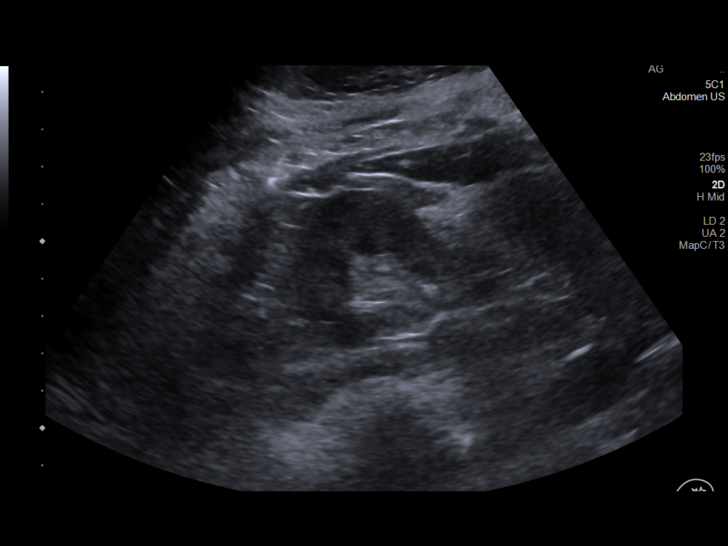
[im 22/47]
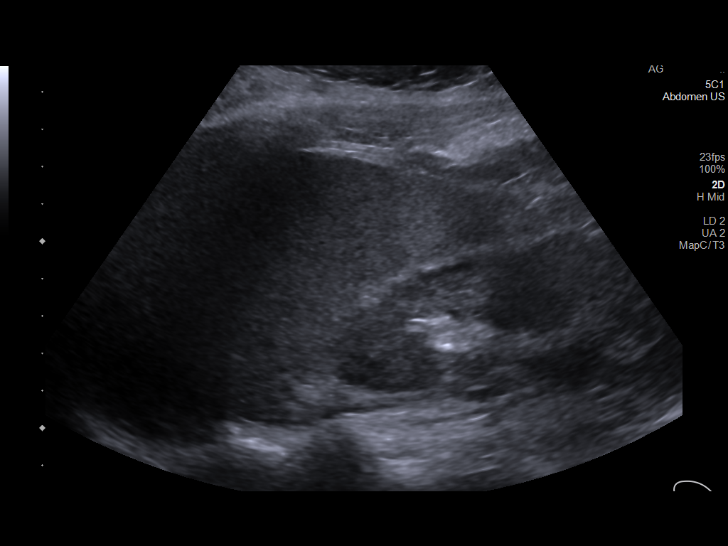
[im 25/47]
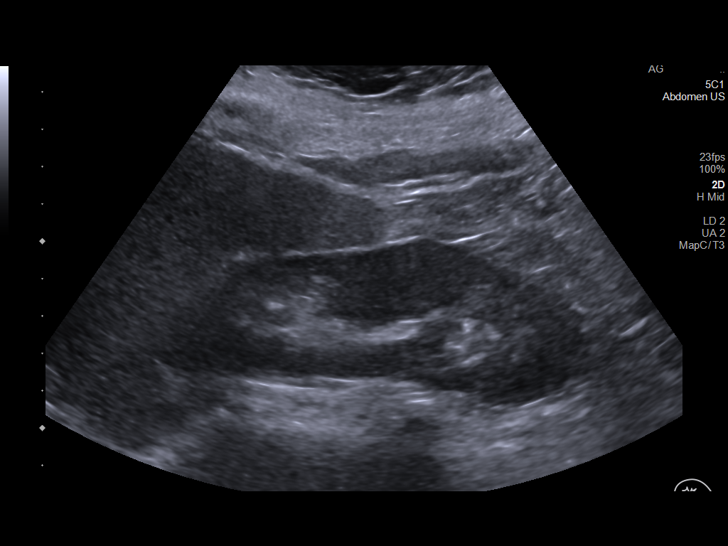
[im 29/47]
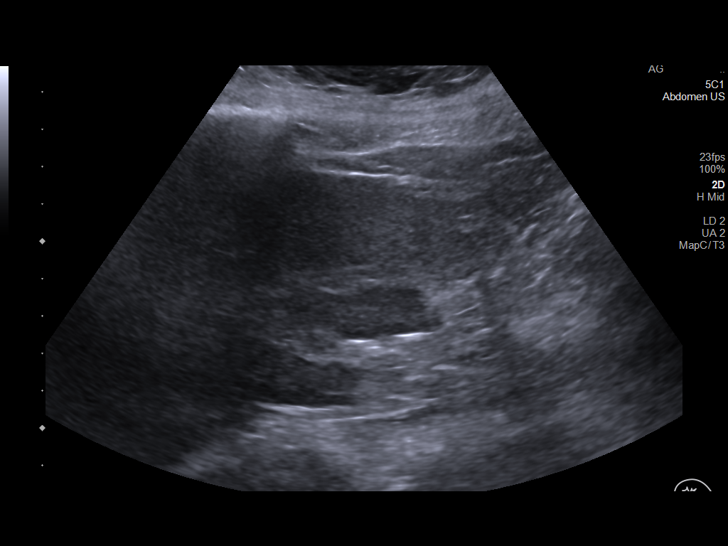
[im 31/47]
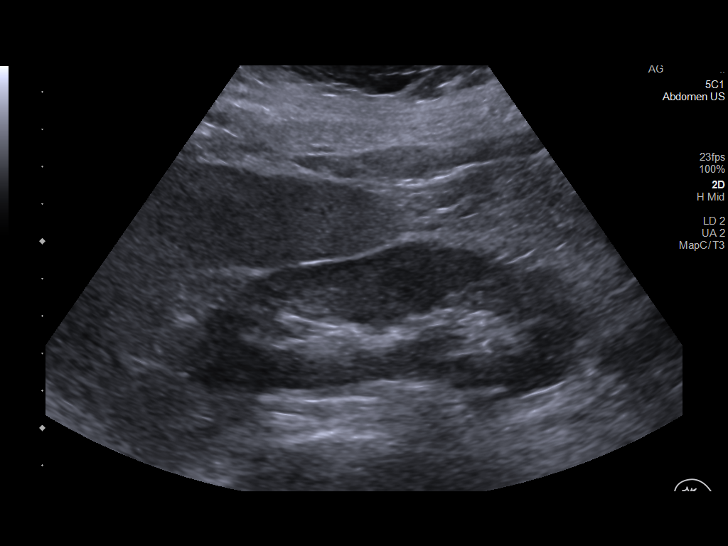
[im 35/47]
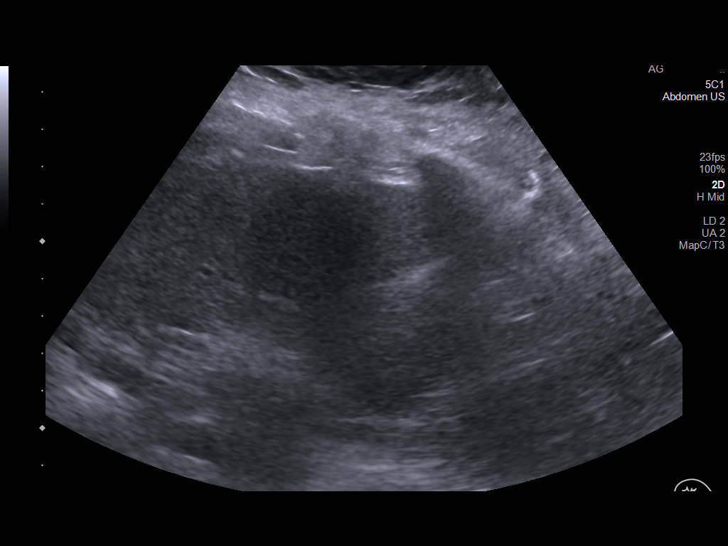
[im 39/47]
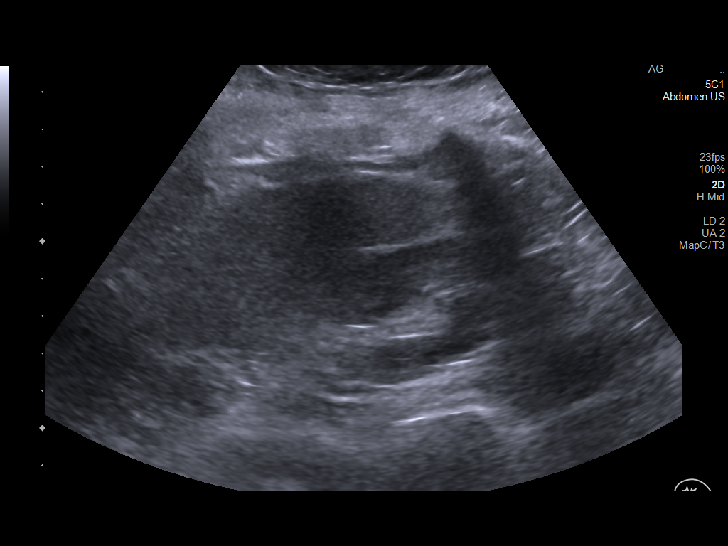
[im 43/47]
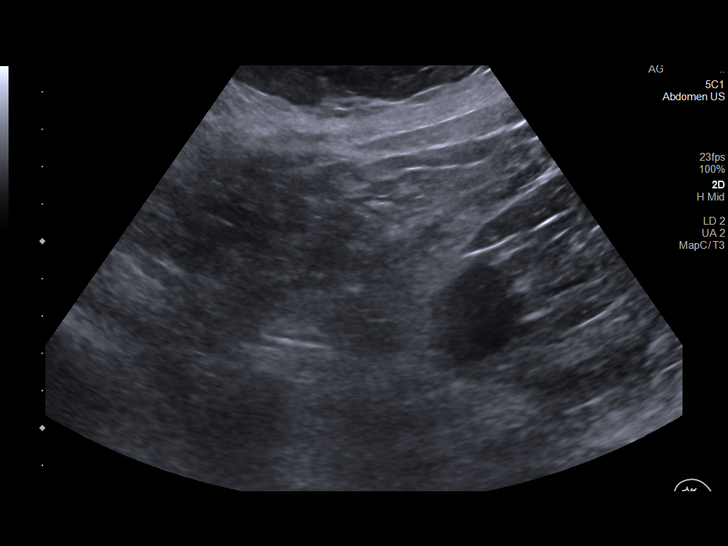
[im 47/47]
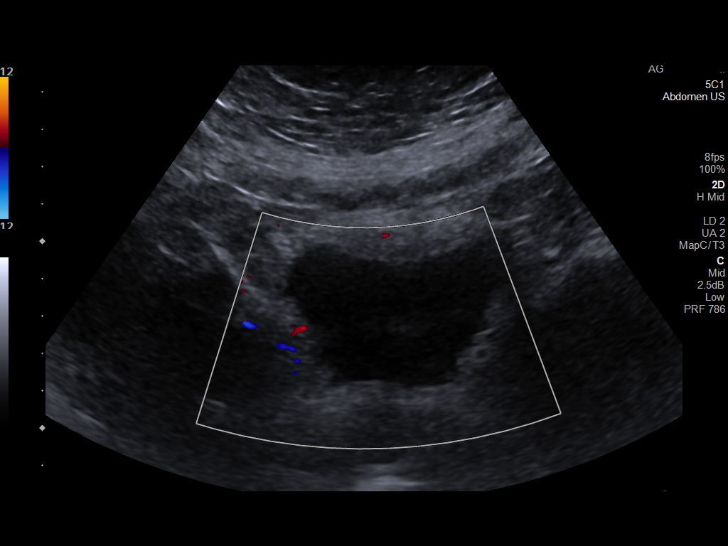

[14 of 25 positions shown; findings below may reference images not displayed]

FINDINGS: Right Kidney:

Renal measurements: 10.7 x 5.0 x 5.0 cm = volume: 140 mL.
Echogenicity within normal limits. No mass or hydronephrosis
visualized.

Left Kidney:

Renal measurements: 10.4 x 3.8 x 4.6 cm = volume: 94 mL.
Echogenicity within normal limits. No mass or hydronephrosis
visualized.

Bladder:

Appears normal for degree of bladder distention.

Other:

None.
IMPRESSION: Negative renal ultrasound. No hydronephrosis.

## 2022-01-10 ENCOUNTER — Ambulatory Visit: Payer: BC Managed Care – PPO | Admitting: Gastroenterology

## 2022-01-10 ENCOUNTER — Encounter: Payer: Self-pay | Admitting: Gastroenterology

## 2022-01-10 VITALS — BP 128/72 | HR 52 | Ht 65.0 in | Wt 161.0 lb

## 2022-01-10 DIAGNOSIS — R140 Abdominal distension (gaseous): Secondary | ICD-10-CM

## 2022-01-10 DIAGNOSIS — K449 Diaphragmatic hernia without obstruction or gangrene: Secondary | ICD-10-CM

## 2022-01-10 DIAGNOSIS — Z86010 Personal history of colonic polyps: Secondary | ICD-10-CM

## 2022-01-10 DIAGNOSIS — K580 Irritable bowel syndrome with diarrhea: Secondary | ICD-10-CM | POA: Diagnosis not present

## 2022-01-10 DIAGNOSIS — K219 Gastro-esophageal reflux disease without esophagitis: Secondary | ICD-10-CM

## 2022-01-10 NOTE — Progress Notes (Signed)
Chief Complaint:    Abdominal pain, nausea/vomiting, weight loss  GI History: 51 year old female with a 20+ year history of intermittent GI symptoms. Started as belching and sourbrash followed by diarrhea, n/v and generalized/lower abdominal pain. Occurrs sporadically. Ocurs approx 4-5 hours post prandial. No dysphagia. No f/c, weight loss. Can miss work with sxs.   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  She has since transitioned ranitidine to Pepcid due to recall.   Hx of reflux (heartburn, regurgitation, belching, sour brash), now relatively well controlled since starting omeprazole 20 mg/day pre dinner.  EGD 2017 with [facility] for reflux symptoms with small hiatal hernia and duodenitis.  - 08/17/2019: Evaluation in GI clinic.  Recommended low FODMAP diet, Levsin, and continued Claritin, Pepcid -07/27/2020: RUQ ultrasound: Mild hepatic steatosis, otherwise normal liver, GB -08/01/2020: Normal CMP, normal CBC (plt 146), B12, TSH - 08/11/2020: Renal ultrasound: Normal, no hydronephrosis - 09/22/2020: Follow-up in the GI clinic.  Diagnosed with overlapping abdominal wall syndrome.  Treated conservatively.  GERD well-controlled. - 09/22/2020: HLA-DQ2/DQ 8 negative - 01/19/2021: Normal CBC, CMP   Endoscopic history: -Colonoscopy (07/13/2019, Dr. Rocco Serene, [facility]): 3 hyperplastic polyps.  Repeat in 7-10 years.  No biopsies performed -EGD (09/28/15, Dr. Britta Mccreedy, [facility]): Duodenal bulb duodenitis, hiatal hernia.  H. pylori negative.   -RUQ [place] (date unknown): Normal per review of prior notes -07/10/2019: Normal CBC, CMP, TSH  HPI:     Patient is a 51 y.o. female presenting to the Gastroenterology Clinic for follow-up.  Last seen by me on 09/22/2020.  Main issue at that time was intermittent RLQ pain which had been on/off for a few years.  Diagnosed with Abdominal Wall Syndrome and treated  conservatively with plan for abdominal wall injection if no improvement to heating pad, rest, etc.  She identifies gluten containing foods as possible trigger for abdominal bloating, so has been avoiding these.  Had been doing well, but symptoms started to recur in January after dietary indiscretions.  Since then, intermittent nausea, nonbloody emesis, abdominal bloating.  Weight had been fluctuating. Lost 20#largely attributed to decreasing meals, but has started regaining the weight lately.  Maintains active lifestyle.  Had stopped Pepcid, Prilosec. Will use Tums prn for HB.   Abdominal Wall Syndrome sxs have since abated.  Review of systems:     No chest pain, no SOB, no fevers, no urinary sx   Past Medical History:  Diagnosis Date   Allergy    Anxiety    Arthritis    Colon polyps    COVID-19    Depression    Gestational diabetes    Hypertension    IBS (irritable bowel syndrome)    Pneumonia     Patient's surgical history, family medical history, social history, medications and allergies were all reviewed in Epic    Current Outpatient Medications  Medication Sig Dispense Refill   SUMAtriptan (IMITREX) 100 MG tablet Take 100 mg by mouth as needed.      valACYclovir (VALTREX) 500 MG tablet Take 500 mg by mouth daily.     amitriptyline (ELAVIL) 10 MG tablet Take 1-2 tablets (10-20 mg total) by mouth at bedtime as needed for sleep. (Patient not taking: Reported on 01/10/2022) 90 tablet 3   famotidine (PEPCID) 20 MG tablet Take 1 tablet (20 mg total) by mouth daily. (NEEDS TO BE SEEN BEFORE NEXT REFILL) (Patient not taking: Reported on 01/10/2022) 30 tablet 0   Hyoscyamine  Sulfate SL (LEVSIN/SL) 0.125 MG SUBL Place 0.125 mg under the tongue every 6 (six) hours as needed. (Patient not taking: Reported on 01/10/2022) 30 tablet 5   loratadine (CLARITIN) 10 MG tablet Take 10 mg by mouth daily. (Patient not taking: Reported on 01/10/2022)     omeprazole (PRILOSEC) 20 MG capsule Take 20 mg by  mouth daily. (Patient not taking: Reported on 01/10/2022)     No current facility-administered medications for this visit.    Physical Exam:     BP 128/72   Pulse (!) 52   Ht '5\' 5"'  (1.651 m)   Wt 161 lb (73 kg)   LMP  (LMP Unknown)   SpO2 93%   BMI 26.79 kg/m   GENERAL:  Pleasant female in NAD PSYCH: : Cooperative, normal affect NEURO: Alert and oriented x 3, no focal neurologic deficits   IMPRESSION and PLAN:    1) IBS-D 2) Abdominal bloating - Trial course of IBgard - Discussed benefit of yoga with IBS patients - May benefit from trial of glutamine - Continue avoidance of exacerbating type foods - Will benefit from 4-6 smaller meals daily, using protein shakes, Ensure, boost, etc. as meal replacement - Provided education and reassurance  3) GERD 4) Hiatal hernia - Symptoms largely well-controlled with dietary/lifestyle modifications - Ok to use Tums or other antacids on demand  5) History of colon polyps - Colonoscopy 07/2019 at [facility] with 3 small hyperplastic polyps.  Repeat in 10 years (2031)  I spent 35 minutes of time, including in depth chart review, independent review of results as outlined above, communicating results with the patient directly, face-to-face time with the patient, coordinating care, and ordering studies and medications as appropriate, and documentation.            [place] ,DO, FACG 01/10/2022, 9:03 AM

## 2022-01-10 NOTE — Patient Instructions (Addendum)
   If you are age 53 or younger, your body mass index should be between 19-25. Your Body mass index is 26.79 kg/m. If this is out of the aformentioned range listed, please consider follow up with your Primary Care Provider.   __________________________________________________________  The [place] [facility] providers would like to encourage you to use [facility] to communicate with providers for non-urgent requests or questions.  Due to long hold times on the telephone, sending your provider a message by [facility] may be a faster and more efficient way to get a response.  Please allow 48 business hours for a response.  Please remember that this is for non-urgent requests.    Due to recent changes in healthcare laws, you may see the results of your imaging and laboratory studies on MyChart before your provider has had a chance to review them.  We understand that in some cases there may be results that are confusing or concerning to you. Not all laboratory results come back in the same time frame and the provider may be waiting for multiple results in order to interpret others.  Please give [place] 48 hours in order for your provider to thoroughly review all the results before contacting the office for clarification of your results.   Please purchase the following medications over the counter and take as directed: IB Guard  Follow up as needed.   Thank you for choosing me and [facility].  Vito Cirigliano, D.O.

## 2022-06-28 ENCOUNTER — Encounter: Payer: Self-pay | Admitting: Family Medicine

## 2022-07-27 ENCOUNTER — Other Ambulatory Visit: Payer: BC Managed Care – PPO

## 2022-07-27 ENCOUNTER — Encounter: Payer: Self-pay | Admitting: Gastroenterology

## 2022-07-27 ENCOUNTER — Ambulatory Visit: Payer: BC Managed Care – PPO | Admitting: Gastroenterology

## 2022-07-27 VITALS — BP 122/80 | HR 84 | Ht 65.0 in

## 2022-07-27 DIAGNOSIS — R140 Abdominal distension (gaseous): Secondary | ICD-10-CM

## 2022-07-27 DIAGNOSIS — K219 Gastro-esophageal reflux disease without esophagitis: Secondary | ICD-10-CM

## 2022-07-27 DIAGNOSIS — R197 Diarrhea, unspecified: Secondary | ICD-10-CM

## 2022-07-27 DIAGNOSIS — Z86010 Personal history of colonic polyps: Secondary | ICD-10-CM | POA: Diagnosis not present

## 2022-07-27 DIAGNOSIS — K580 Irritable bowel syndrome with diarrhea: Secondary | ICD-10-CM | POA: Diagnosis not present

## 2022-07-27 DIAGNOSIS — K449 Diaphragmatic hernia without obstruction or gangrene: Secondary | ICD-10-CM

## 2022-07-27 MED ORDER — RIFAXIMIN 550 MG PO TABS: 550.0000 mg | ORAL_TABLET | Freq: Three times a day (TID) | ORAL | 1 refills | Status: DC

## 2022-07-27 NOTE — Progress Notes (Signed)
Chief Complaint:    Foul-smelling diarrhea  GI History: 52 year old female with a 20+ year history of intermittent GI symptoms. Started as belching and sourbrash followed by diarrhea, n/v and generalized/lower abdominal pain. Occurrs sporadically. Ocurs approx 4-5 hours post prandial. No dysphagia. No f/c, weight loss. Can miss work with sxs.   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  She has since transitioned ranitidine to Pepcid due to recall.   Hx of reflux (heartburn, regurgitation, belching, sour brash), now relatively well controlled since starting omeprazole 20 mg/day pre dinner.  EGD 2017 with [facility] for reflux symptoms with small hiatal hernia and duodenitis.  Now reflux symptoms well-controlled with dietary modification alone with rare Tums on demand.  - 08/17/2019: Evaluation in GI clinic.  Recommended low FODMAP diet, Levsin, and continued Claritin, Pepcid -07/27/2020: RUQ ultrasound: Mild hepatic steatosis, otherwise normal liver, GB -08/01/2020: Normal CMP, normal CBC (plt 146), B12, TSH - 08/11/2020: Renal ultrasound: Normal, no hydronephrosis - 09/22/2020: Follow-up in the GI clinic.  Diagnosed with overlapping abdominal wall syndrome.  Treated conservatively.  GERD well-controlled. - 09/22/2020: HLA-DQ2/DQ 8 negative - 01/19/2021: Normal CBC, CMP   Endoscopic history: -Colonoscopy (07/13/2019, Dr. Rocco Serene, [facility]): 3 hyperplastic polyps.  Repeat in 7-10 years.  No biopsies performed -EGD (09/28/15, Dr. Britta Mccreedy, [facility]): Duodenal bulb duodenitis, hiatal hernia.  H. pylori negative.   -RUQ [place] (date unknown): Normal per review of prior notes -07/10/2019: Normal CBC, CMP, TSH  HPI:     Patient is a 52 y.o. female presenting to the Gastroenterology Clinic for follow-up. Last seen in the GI Clinic on 01/10/2022 by me.  Main issue at that time was intermittent nausea,  nonbloody emesis, abdominal bloating, weight fluctuation, all attributed to her IBS-D.  Recommended IBgard, and yoga/exercise, dietary mods syndrome.  Main issue today is foul-smelling stool since September. Still with a range of stools throughout the week from diarrheal to formed soft stools. But foul smell with any consistency of stools which bothers her. No hematochezia. Has tried to modify diet, but no change.   No more nausea/vomiting.   Additionally, has intermittent RUQ and lower abdominal pain, described as cramping.  Tends to resolve with BM.  No recent labs or abdominal imaging for review.  Review of systems:     No chest pain, no SOB, no fevers, no urinary sx   Past Medical History:  Diagnosis Date   Allergy    Anxiety    Arthritis    Colon polyps    COVID-19    Depression    Gestational diabetes    Hypertension    IBS (irritable bowel syndrome)    Pneumonia     Patient's surgical history, family medical history, social history, medications and allergies were all reviewed in Epic    Current Outpatient Medications  Medication Sig Dispense Refill   amitriptyline (ELAVIL) 10 MG tablet Take 1-2 tablets (10-20 mg total) by mouth at bedtime as needed for sleep. (Patient not taking: Reported on 01/10/2022) 90 tablet 3   famotidine (PEPCID) 20 MG tablet Take 1 tablet (20 mg total) by mouth daily. (NEEDS TO BE SEEN BEFORE NEXT REFILL) (Patient not taking: Reported on 01/10/2022) 30 tablet 0   Hyoscyamine Sulfate SL (LEVSIN/SL) 0.125 MG SUBL Place 0.125 mg under the tongue every 6 (six) hours as needed. (Patient not taking: Reported on 01/10/2022) 30 tablet 5   loratadine (CLARITIN) 10 MG tablet Take 10  mg by mouth daily. (Patient not taking: Reported on 01/10/2022)     omeprazole (PRILOSEC) 20 MG capsule Take 20 mg by mouth daily. (Patient not taking: Reported on 01/10/2022)     SUMAtriptan (IMITREX) 100 MG tablet Take 100 mg by mouth as needed.      valACYclovir (VALTREX) 500 MG tablet  Take 500 mg by mouth daily.     No current facility-administered medications for this visit.    Physical Exam:     LMP  (LMP Unknown)   GENERAL:  Pleasant female in NAD PSYCH: : Cooperative, normal affect Musculoskeletal:  Normal muscle tone, normal strength NEURO: Alert and oriented x 3, no focal neurologic deficits   IMPRESSION and PLAN:    1) IBS-D 2) Abdominal bloating/abdominal cramping 3) Foul-smelling stools 4) Diarrhea/change in bowel habits     -Check stool studies: Fecal calprotectin, pancreatic elastase, GI PCR panel, fecal fat -Trial Rifaximin 550 mg TID x14 days, RF1 -Start probiotic completion of rifaximin course - If no improvement, can repeat colonoscopy with random and directed biopsies for microscopic colitis, IBD, etc.  5) GERD 6) Hiatal hernia Reflux symptoms well-controlled with dietary modification alone.  Very rare use of Tums.  No change to current plan  7) History of colon polyps - Colonoscopy 07/2019 at [facility] with 3 small hyperplastic polyps.  Repeat in 10 years (2031)       Sierra Pittman ,DO, [facility] 07/27/2022, 1:38 PM

## 2022-07-27 NOTE — Patient Instructions (Addendum)
Your provider has requested that you go to the basement level for lab work before leaving today. Press "B" on the elevator. The lab is located at the first door on the left as you exit the elevator.   We have sent the following medications to your pharmacy for you to pick up at your convenience:  Rifaximin 550 Mg Three times daily for 14 days.  Start Probiotic.  _______________________________________________________  If your blood pressure at your visit was 140/90 or greater, please contact your primary care physician to follow up on this.  ______________________________________________________.  If you are age 62 or younger, your body mass index should be between 19-25. Your Body mass index is 26.79 kg/m. If this is out of the aformentioned range listed, please consider follow up with your Primary Care Provider.   __________________________________________________________  The [place] [facility] providers would like to encourage you to use [facility] to communicate with providers for non-urgent requests or questions.  Due to long hold times on the telephone, sending your provider a message by [facility] may be a faster and more efficient way to get a response.  Please allow 48 business hours for a response.  Please remember that this is for non-urgent requests.   Due to recent changes in healthcare laws, you may see the results of your imaging and laboratory studies on MyChart before your provider has had a chance to review them.  We understand that in some cases there may be results that are confusing or concerning to you. Not all laboratory results come back in the same time frame and the provider may be waiting for multiple results in order to interpret others.  Please give [place] 48 hours in order for your provider to thoroughly review all the results before contacting the office for clarification of your results.    Thank you for choosing me and [facility].  Vito Cirigliano, D.O.

## 2022-07-30 ENCOUNTER — Other Ambulatory Visit: Payer: BC Managed Care – PPO

## 2022-07-30 DIAGNOSIS — R197 Diarrhea, unspecified: Secondary | ICD-10-CM

## 2022-07-30 DIAGNOSIS — K580 Irritable bowel syndrome with diarrhea: Secondary | ICD-10-CM

## 2022-07-30 DIAGNOSIS — R140 Abdominal distension (gaseous): Secondary | ICD-10-CM

## 2022-08-01 ENCOUNTER — Telehealth: Payer: Self-pay

## 2022-08-01 NOTE — Telephone Encounter (Signed)
[facility] called with results from GI profile - Giardia lamblia DETECTED

## 2022-08-01 NOTE — Telephone Encounter (Signed)
GI PCR panel detected Giardia infection.  Plan to treat as below: - Tinidazole 2 g p.o. as a single dose.  RF 0

## 2022-08-02 MED ORDER — TINIDAZOLE 500 MG PO TABS: 2.0000 g | ORAL_TABLET | Freq: Once | ORAL | 0 refills | Status: AC

## 2022-08-02 NOTE — Telephone Encounter (Signed)
Called and spoke with patient regarding GI profile stool study results. Pt is aware that I have sent a prescription for Tinidazole to her [facility] pharmacy on file. Pt is aware that we will be in touch once the remainder of her stool studies return. Pt verbalized understanding and had no concerns at the end of the call.

## 2022-08-02 NOTE — Addendum Note (Signed)
Addended by: Yevette Edwards on: 08/02/2022 09:09 AM   Modules accepted: Orders

## 2022-08-03 LAB — GI PROFILE, STOOL, PCR
Adenovirus F 40/41: NOT DETECTED
Astrovirus: NOT DETECTED
C difficile toxin A/B: NOT DETECTED
Campylobacter: NOT DETECTED
Cryptosporidium: NOT DETECTED
Cyclospora cayetanensis: NOT DETECTED
Entamoeba histolytica: NOT DETECTED
Enteroaggregative E coli: NOT DETECTED
Enteropathogenic E coli: NOT DETECTED
Enterotoxigenic E coli: NOT DETECTED
Giardia lamblia: DETECTED — AB
Norovirus GI/GII: NOT DETECTED
Plesiomonas shigelloides: NOT DETECTED
Rotavirus A: NOT DETECTED
Salmonella: NOT DETECTED
Sapovirus: NOT DETECTED
Shiga-toxin-producing E coli: NOT DETECTED
Shigella/Enteroinvasive E coli: NOT DETECTED
Vibrio cholerae: NOT DETECTED
Vibrio: NOT DETECTED
Yersinia enterocolitica: NOT DETECTED

## 2022-08-03 LAB — FECAL FAT, QUALITATIVE
Fat Qual Neutral, Stl: INCREASED
Fat Qual Total, Stl: INCREASED

## 2022-08-04 LAB — CALPROTECTIN, FECAL: Calprotectin, Fecal: 7 ug/g (ref 0–120)

## 2022-08-04 LAB — PANCREATIC ELASTASE, FECAL: Pancreatic Elastase-1, Stool: 476 mcg/g

## 2022-08-07 ENCOUNTER — Ambulatory Visit (INDEPENDENT_AMBULATORY_CARE_PROVIDER_SITE_OTHER): Payer: BC Managed Care – PPO | Admitting: Family Medicine

## 2022-08-07 ENCOUNTER — Encounter: Payer: Self-pay | Admitting: Family Medicine

## 2022-08-07 VITALS — BP 120/83 | HR 77 | Temp 98.7°F | Ht 65.0 in | Wt 160.0 lb

## 2022-08-07 DIAGNOSIS — R2231 Localized swelling, mass and lump, right upper limb: Secondary | ICD-10-CM

## 2022-08-07 DIAGNOSIS — K529 Noninfective gastroenteritis and colitis, unspecified: Secondary | ICD-10-CM | POA: Diagnosis not present

## 2022-08-07 DIAGNOSIS — E782 Mixed hyperlipidemia: Secondary | ICD-10-CM

## 2022-08-07 DIAGNOSIS — M5432 Sciatica, left side: Secondary | ICD-10-CM | POA: Diagnosis not present

## 2022-08-07 DIAGNOSIS — D696 Thrombocytopenia, unspecified: Secondary | ICD-10-CM | POA: Diagnosis not present

## 2022-08-07 DIAGNOSIS — Z0001 Encounter for general adult medical examination with abnormal findings: Secondary | ICD-10-CM

## 2022-08-07 DIAGNOSIS — T753XXA Motion sickness, initial encounter: Secondary | ICD-10-CM | POA: Diagnosis not present

## 2022-08-07 DIAGNOSIS — Z0000 Encounter for general adult medical examination without abnormal findings: Secondary | ICD-10-CM

## 2022-08-07 DIAGNOSIS — Z124 Encounter for screening for malignant neoplasm of cervix: Secondary | ICD-10-CM | POA: Diagnosis not present

## 2022-08-07 MED ORDER — ONDANSETRON 4 MG PO TBDP: 4.0000 mg | ORAL_TABLET | Freq: Three times a day (TID) | ORAL | 0 refills | Status: DC | PRN

## 2022-08-07 MED ORDER — SCOPOLAMINE 1 MG/3DAYS TD PT72: 1.0000 | MEDICATED_PATCH | TRANSDERMAL | 12 refills | Status: DC

## 2022-08-07 NOTE — Progress Notes (Signed)
Sierra Pittman is a 52 y.o. female presents to office today for annual physical exam examination.    Concerns today include: 1. GI issues She saw gastroenterology in the last week and was placed on Xifaxan and hyoscyamine as needed.  She continues to have quite a bit of GI upset with various foods and thinks that perhaps her intolerances but she is not sure.  She is hopeful that the Xifaxan will resolve her issue and that she can start going back to normal eating.  She would like to be checked for vitamin deficiencies given very limited food items that she can eat.  2.  Pinky lesion Patient reports a spot on her right pinky that is sometimes sore to touch but otherwise asymptomatic.  No sensory changes or difficulty moving the pinky  3.  Motion sickness Patient with history of motion sickness.  She would like the scopolamine patch and perhaps something for nausea.  She is going on a cruise soon and does not want to become ill.  Marital status: has a boyfriend, Substance use: none Diet: very limited. Admits to chips/ sodas. Last colonoscopy: UTD Last mammogram: UTD Last pap smear: sees OB in [place].  Has pap scheduled as last year's was abnormal Refills needed today: none Immunizations needed: Immunization History  Administered Date(s) Administered   Influenza,inj,Quad PF,6+ Mos 03/05/2016   PFIZER(Purple Top)SARS-COV-2 Vaccination 08/09/2019, 08/30/2019   Td 01/11/2011   Tdap 01/11/2011   Zoster Recombinat (Shingrix) 01/19/2021, 09/11/2021     Past Medical History:  Diagnosis Date   Allergy    Anxiety    Arthritis    Colon polyps    COVID-19    Depression    Gestational diabetes    Hypertension    IBS (irritable bowel syndrome)    Pneumonia    Social History   Socioeconomic History   Marital status: Divorced    Spouse name: Not on file   Number of children: 2   Years of education: Not on file   Highest education level: Not on file  Occupational History   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 Has 1 dog. Drinks 3 diet sodas a day. Exercises 3-5x a week at the gym. Has a bachelors degree. 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   Social Determinants of Health   Financial Resource Strain: Not on file  Food Insecurity: Not on file  Transportation Needs: Not on file  Physical Activity: Not on file  Stress: Not on file  Social Connections: Not on file  Intimate Partner Violence: Not on file   Past Surgical History:  Procedure Laterality Date   COLONOSCOPY  07/13/2019   Adelina Mings. Done in [place]   ESOPHAGOGASTRODUODENOSCOPY     Around 2016 in [place] as well. had a hiatal henia a lot of acid and possibly h. pylori  Family History  Problem Relation Age of Onset   Hyperlipidemia Mother    Hypertension Father    Diabetes Father    Hyperlipidemia Father    Dementia Maternal Grandmother    Prostate cancer Maternal Grandfather    Hypertension Paternal Grandmother    Dementia Paternal Grandfather    Hyperlipidemia Brother    Colon polyps Brother    Hyperlipidemia Brother    Hyperlipidemia Brother    Colon cancer Neg Hx    Esophageal cancer Neg Hx     Current Outpatient Medications:    Hyoscyamine Sulfate SL (LEVSIN/SL) 0.125 MG SUBL, Place 0.125 mg under the tongue every 6 (six) hours as needed., Disp: 30 tablet, Rfl: 5   rifaximin (XIFAXAN) 550 MG TABS tablet, Take 1 tablet (550 mg total) by mouth 3 (three)  times daily., Disp: 42 tablet, Rfl: 1   SUMAtriptan (IMITREX) 100 MG tablet, Take 100 mg by mouth as needed. , Disp: , Rfl:    valACYclovir (VALTREX) 500 MG tablet, Take 500 mg by mouth as needed., Disp: , Rfl:   Allergies  Allergen Reactions   Amoxicillin Rash   Histamine Nausea And Vomiting   Penicillins Rash     ROS: Review of Systems A comprehensive review of systems was negative except for: Musculoskeletal: positive for sciatica with prolonged sitting Neurological: positive for headaches  and items stated in HPI  Physical exam BP 120/83   Pulse 77   Temp 98.7 F (37.1 C)   Ht '5\' 5"'$  (1.651 m)   Wt 160 lb (72.6 kg)   LMP  (LMP Unknown)   SpO2 95%   BMI 26.63 kg/m  General appearance: alert, cooperative, appears stated age, and no distress Head: Normocephalic, without obvious abnormality, atraumatic Eyes: negative findings: lids and lashes normal, conjunctivae and sclerae normal, corneas clear, and pupils equal, round, reactive to light and accomodation Ears: normal TM's and external ear canals both ears Nose: Nares normal. Septum midline. Mucosa normal. No drainage or sinus tenderness. Throat: lips, mucosa, and tongue normal; teeth and gums normal Neck: no adenopathy, supple, symmetrical, trachea midline, and thyroid not enlarged, symmetric, no tenderness/mass/nodules Back: symmetric, no curvature. ROM normal. No CVA tenderness. Lungs: clear to auscultation bilaterally Heart: regular rate and rhythm, S1, S2 normal, no murmur, click, rub or gallop Abdomen: soft, non-tender; bowel sounds normal; no masses,  no organomegaly and excess abdominal skin present Extremities: extremities normal, atraumatic, no cyanosis or edema Pulses: 2+ and symmetric Skin: Skin color, texture, turgor normal. No rashes or lesions Lymph nodes: Cervical, supraclavicular, and axillary nodes normal. Neurologic: Grossly normal Psych: mood stable speech normal MSK: ambulating independently with  normal gait and station, normal tone and strength Right pinky: small mobile rubbery nodule appreciated on the dorsal aspect of the PIP joint    Assessment/ Plan: Langston Reusing here for annual physical exam.   Annual physical exam  Screening for malignant neoplasm of cervix - Plan: CANCELED: Cytology - PAP  Mixed hyperlipidemia - Plan: CMP14+EGFR, Lipid panel, TSH  Decreased platelet count (HCC) - Plan: CBC  Chronic diarrhea - Plan: VITAMIN D 25 Hydroxy (Vit-D Deficiency, Fractures), Vitamin B12, Iron, TIBC and Ferritin Panel, T4, free, Alpha-Gal Panel  Motion sickness, initial encounter - Plan: scopolamine (TRANSDERM-SCOP) 1 MG/3DAYS, ondansetron (ZOFRAN-ODT) 4 MG disintegrating tablet  Nodule of finger of right hand  Sciatica, left side  Pap per OB/GYN.  Release of information form completed today  She will schedule fasting labs.  We will look for vitamin deficiency given chronic  diarrhea and very limited options for intake due to chronic GI issues  Check CBC  Zofran and scopolamine sent for motion sickness.  Discussed potential side effects and indications for use  Lesion on the right finger appears to be nodular.?  Cartilagenous vs cystic. Declines referral at this time as it is not symptomatic.  I advised to pretreat with 2 tablets of OTC Aleve with a meal and a full glass of water prior to her trip as she will be sitting for a long time.  Also recommended frequent breaks and stretches  Counseled on healthy lifestyle choices, including diet (rich in fruits, vegetables and lean meats and low in salt and simple carbohydrates) and exercise (at least 30 minutes of moderate physical activity daily).  Patient to follow up in 1 year for annual exam or sooner if needed.  Marilea Gwynne M. Lajuana Ripple, DO

## 2022-08-09 ENCOUNTER — Other Ambulatory Visit: Payer: BC Managed Care – PPO

## 2022-08-09 DIAGNOSIS — K529 Noninfective gastroenteritis and colitis, unspecified: Secondary | ICD-10-CM

## 2022-08-09 DIAGNOSIS — E782 Mixed hyperlipidemia: Secondary | ICD-10-CM

## 2022-08-09 DIAGNOSIS — D696 Thrombocytopenia, unspecified: Secondary | ICD-10-CM

## 2022-08-10 ENCOUNTER — Other Ambulatory Visit: Payer: Self-pay | Admitting: Family Medicine

## 2022-08-10 DIAGNOSIS — E559 Vitamin D deficiency, unspecified: Secondary | ICD-10-CM

## 2022-08-10 MED ORDER — VITAMIN D (ERGOCALCIFEROL) 1.25 MG (50000 UNIT) PO CAPS: 50000.0000 [IU] | ORAL_CAPSULE | ORAL | 3 refills | Status: DC

## 2022-08-14 ENCOUNTER — Other Ambulatory Visit: Payer: Self-pay | Admitting: *Deleted

## 2022-08-14 DIAGNOSIS — E559 Vitamin D deficiency, unspecified: Secondary | ICD-10-CM

## 2022-08-14 LAB — IRON,TIBC AND FERRITIN PANEL
Ferritin: 188 ng/mL — ABNORMAL HIGH (ref 15–150)
Iron Saturation: 22 % (ref 15–55)
Iron: 63 ug/dL (ref 27–159)
Total Iron Binding Capacity: 284 ug/dL (ref 250–450)
UIBC: 221 ug/dL (ref 131–425)

## 2022-08-14 LAB — CMP14+EGFR
ALT: 11 IU/L (ref 0–32)
AST: 19 IU/L (ref 0–40)
Albumin/Globulin Ratio: 1.9 (ref 1.2–2.2)
Albumin: 4.5 g/dL (ref 3.8–4.9)
Alkaline Phosphatase: 77 IU/L (ref 44–121)
BUN/Creatinine Ratio: 16 (ref 9–23)
BUN: 12 mg/dL (ref 6–24)
Bilirubin Total: 0.5 mg/dL (ref 0.0–1.2)
CO2: 23 mmol/L (ref 20–29)
Calcium: 9.6 mg/dL (ref 8.7–10.2)
Chloride: 101 mmol/L (ref 96–106)
Creatinine, Ser: 0.75 mg/dL (ref 0.57–1.00)
Globulin, Total: 2.4 g/dL (ref 1.5–4.5)
Glucose: 90 mg/dL (ref 70–99)
Potassium: 4 mmol/L (ref 3.5–5.2)
Sodium: 136 mmol/L (ref 134–144)
Total Protein: 6.9 g/dL (ref 6.0–8.5)
eGFR: 95 mL/min/{1.73_m2} (ref >59)

## 2022-08-14 LAB — ALPHA-GAL PANEL
Allergen Lamb IgE: <0.1 kU/L
Beef IgE: <0.1 kU/L
IgE (Immunoglobulin E), Serum: 8 IU/mL (ref 6–495)
O215-IgE Alpha-Gal: <0.1 kU/L
Pork IgE: <0.1 kU/L

## 2022-08-14 LAB — CBC
Hematocrit: 41.4 % (ref 34.0–46.6)
Hemoglobin: 14.5 g/dL (ref 11.1–15.9)
MCH: 31 pg (ref 26.6–33.0)
MCHC: 35 g/dL (ref 31.5–35.7)
MCV: 89 fL (ref 79–97)
Platelets: 190 10*3/uL (ref 150–450)
RBC: 4.67 x10E6/uL (ref 3.77–5.28)
RDW: 12.7 % (ref 11.7–15.4)
WBC: 4.8 10*3/uL (ref 3.4–10.8)

## 2022-08-14 LAB — LIPID PANEL
Chol/HDL Ratio: 3.1 ratio (ref 0.0–4.4)
Cholesterol, Total: 199 mg/dL (ref 100–199)
HDL: 65 mg/dL (ref >39)
LDL Chol Calc (NIH): 110 mg/dL — ABNORMAL HIGH (ref 0–99)
Triglycerides: 138 mg/dL (ref 0–149)
VLDL Cholesterol Cal: 24 mg/dL (ref 5–40)

## 2022-08-14 LAB — TSH: TSH: 2.02 u[IU]/mL (ref 0.450–4.500)

## 2022-08-14 LAB — VITAMIN B12: Vitamin B-12: 325 pg/mL (ref 232–1245)

## 2022-08-14 LAB — T4, FREE: Free T4: 1.14 ng/dL (ref 0.82–1.77)

## 2022-08-14 LAB — VITAMIN D 25 HYDROXY (VIT D DEFICIENCY, FRACTURES): Vit D, 25-Hydroxy: 10.4 ng/mL — ABNORMAL LOW (ref 30.0–100.0)

## 2022-11-22 ENCOUNTER — Other Ambulatory Visit: Payer: BC Managed Care – PPO

## 2022-11-22 DIAGNOSIS — E559 Vitamin D deficiency, unspecified: Secondary | ICD-10-CM

## 2022-11-23 LAB — VITAMIN D 25 HYDROXY (VIT D DEFICIENCY, FRACTURES): Vit D, 25-Hydroxy: 35.3 ng/mL (ref 30.0–100.0)

## 2023-01-14 ENCOUNTER — Ambulatory Visit: Payer: BC Managed Care – PPO | Admitting: Family Medicine

## 2023-01-14 ENCOUNTER — Encounter: Payer: Self-pay | Admitting: Family Medicine

## 2023-01-14 VITALS — BP 134/81 | HR 82 | Temp 97.3°F | Resp 20 | Ht 65.0 in | Wt 171.0 lb

## 2023-01-14 DIAGNOSIS — H1013 Acute atopic conjunctivitis, bilateral: Secondary | ICD-10-CM | POA: Diagnosis not present

## 2023-01-14 DIAGNOSIS — H01021 Squamous blepharitis right upper eyelid: Secondary | ICD-10-CM | POA: Diagnosis not present

## 2023-01-14 MED ORDER — ERYTHROMYCIN 5 MG/GM OP OINT: 1.0000 | TOPICAL_OINTMENT | Freq: Three times a day (TID) | OPHTHALMIC | 0 refills | Status: AC

## 2023-01-14 MED ORDER — OLOPATADINE HCL 0.1 % OP SOLN: 1.0000 [drp] | Freq: Two times a day (BID) | OPHTHALMIC | 12 refills | Status: AC

## 2023-01-14 NOTE — Patient Instructions (Signed)
 Blepharitis Blepharitis is swelling of the eyelids. It can cause the eyes to feel dry or gritty. Other symptoms may include: Reddish, scaly skin around the scalp and eyebrows. Eyelids that itch or burn. Fluid that leaks from the eye at night. This causes the eyelashes to stick together in the morning. Eyelashes that fall out. Redness of the eyes. Eyes that are sensitive to light. Follow these instructions at home: Watch for any changes in how your eyes look or feel. Tell your doctor about any changes. Follow these instructions to help with your condition. Keeping clean Wash your hands often with soap and water for at least 20 seconds. Clean your eyes. Wash the edges of your eyelids using eyelid wipes or a small amount of baby shampoo that has been mixed with warm water (diluted). Do this 2 or more times a day. Wash your face and eyebrows at least once a day. Use a clean towel each time you dry your eyelids. Do not use the towel to clean or dry other areas of your body. Do not share your towel with anyone. General instructions Avoid wearing makeup until you get better. Do not share makeup with anyone. Avoid rubbing your eyes. Use a warm compress on your eyes for 5-10 minutes at a time. Do this 1 or 2 times a day, or as told by your doctor. You can use: A towel with warm water on it. A heating pad that can be warmed in the microwave. The pad should be very warm but not hot enough to burn the skin. If you were given an antibiotic cream or eye drops, use the medicine as told by your doctor. Do not stop using the medicine even if you feel better. Keep all follow-up visits. Contact a doctor if: Your eyelids feel hot. You have blisters on your eyelids. You have a rash on your eyelids. The swelling does not go away in 2-4 days. The swelling gets worse. Get help right away if: You have pain that gets worse or spreads to other parts of your face. You have redness that gets worse or spreads to  other parts of your face. You have changes in how you see (vision). You have pain when you look at lights or things that move. You have a fever. Summary Blepharitis is swelling of the eyelids. Watch for any changes in how your eyes look or feel. Tell your doctor about any changes. Follow home care instructions as told by your doctor. Wash your hands often with soap and water for at least 20 seconds. Avoid wearing makeup. Do not rub your eyes. Use a warm compress, creams, or eye drops as told by your doctor. Let your doctor know if you have changes in how you see, blisters or a rash on your eyelids, or other problems. This information is not intended to replace advice given to you by your health care provider. Make sure you discuss any questions you have with your health care provider. Document Revised: 06/22/2020 Document Reviewed: 06/22/2020 Elsevier Patient Education  2024 [employer].

## 2023-01-14 NOTE — Progress Notes (Signed)
Subjective: CC: Eyelid itching PCP: Raliegh Ip, DO [id number] Koster is a 52 y.o. female presenting to clinic today for:  1.  Eyelid itching Patient reports onset of right upper eyelid itching about 1 week ago.  Now she has a spot on the right upper eyelid that is sore and red.  She has discontinued utilization of her contacts because she was worried that perhaps that was contributing.  She notes that the symptoms seem to be present only when she is around her daughter's dog and she wonders if perhaps maybe she has a allergy to dogs.  She denies any pain or redness of the eyeball.  No photosensitivity.  Has not been utilizing anything but has been avoiding use of contact lenses.   ROS: Per HPI  Allergies  Allergen Reactions   Amoxicillin Rash   Histamine Nausea And Vomiting   Penicillins Rash   Past Medical History:  Diagnosis Date   Allergy    Anxiety    Arthritis    Colon polyps    COVID-19    Depression    Gestational diabetes    Hypertension    IBS (irritable bowel syndrome)    Pneumonia     Current Outpatient Medications:    cholecalciferol (VITAMIN D3) 25 MCG (1000 UNIT) tablet, Take 1,000 Units by mouth daily., Disp: , Rfl:    erythromycin ophthalmic ointment, Place 1 Application into the right eye 3 (three) times daily for 7 days., Disp: 3.5 g, Rfl: 0   Hyoscyamine Sulfate SL (LEVSIN/SL) 0.125 MG SUBL, Place 0.125 mg under the tongue every 6 (six) hours as needed., Disp: 30 tablet, Rfl: 5   olopatadine (PATANOL) 0.1 % ophthalmic solution, Place 1 drop into both eyes 2 (two) times daily., Disp: 5 mL, Rfl: 12   ondansetron (ZOFRAN-ODT) 4 MG disintegrating tablet, Take 1 tablet (4 mg total) by mouth every 8 (eight) hours as needed for nausea or vomiting., Disp: 20 tablet, Rfl: 0   SUMAtriptan (IMITREX) 100 MG tablet, Take 100 mg by mouth as needed. , Disp: , Rfl:    valACYclovir (VALTREX) 500 MG tablet, Take 500 mg by mouth as needed., Disp: , Rfl:    rifaximin  (XIFAXAN) 550 MG TABS tablet, Take 1 tablet (550 mg total) by mouth 3 (three) times daily. (Patient not taking: Reported on 01/14/2023), Disp: 42 tablet, Rfl: 1   scopolamine (TRANSDERM-SCOP) 1 MG/3DAYS, Place 1 patch (1.5 mg total) onto the skin every 3 (three) days. 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 Has 1 dog. Drinks 3 diet sodas a day. Exercises 3-5x a week at the gym. Has a bachelors degree. 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   Social Determinants of Health   Financial Resource Strain: Not on file  Food Insecurity: Not on file  Transportation Needs: Not on file  Physical Activity: Not on file  Stress: Not on file  Social Connections: Not on file  Intimate Partner Violence: Not At Risk (12/10/2022)   Received from [facility]   Humiliation, Afraid, Rape, and Kick questionnaire    Fear of Current or Ex-Partner: No    Emotionally Abused: No    Physically Abused: No    Sexually Abused: No   Family History  Problem Relation Age of Onset   Hyperlipidemia Mother    Hypertension Father    Diabetes  Father    Hyperlipidemia Father    Dementia Maternal Grandmother    Prostate cancer Maternal Grandfather    Hypertension Paternal Grandmother    Dementia Paternal Grandfather    Hyperlipidemia Brother    Colon polyps Brother    Hyperlipidemia Brother    Hyperlipidemia Brother    Colon cancer Neg Hx    Esophageal cancer Neg Hx     Objective: Office vital signs reviewed. BP 134/81   Pulse 82   Temp (!) 97.3 F (36.3 C) (Oral)   Resp 20   Ht 5\' 5"  (1.651 m)   Wt 171 lb (77.6 kg)   LMP  (LMP Unknown)   SpO2 93%   BMI 28.46 kg/m   Physical Examination:  General: Awake, alert, well nourished, No acute distress HEENT: Right upper lateral lid with hyperemia and soft tissue swelling.  She has no exudative process appreciated.  Conjunctiva are clear. Assessment/ Plan: 52 y.o. female   Squamous blepharitis of right upper eyelid - Plan: erythromycin ophthalmic ointment, olopatadine (PATANOL) 0.1 % ophthalmic solution  Allergic conjunctivitis of both eyes - Plan: olopatadine (PATANOL) 0.1 % ophthalmic solution  Right upper lid with some hyperemia and soft tissue swelling.  Will empirically treat for secondary bacterial infection but we discussed primary modality of treatment will be allergy control.  Discussed utilization of air purifier in efforts to reduce allergic contacts.   Raliegh Ip, DO [facility] [phone]

## 2023-02-07 ENCOUNTER — Other Ambulatory Visit: Payer: Self-pay | Admitting: Oncology

## 2023-02-07 DIAGNOSIS — Z006 Encounter for examination for normal comparison and control in clinical research program: Secondary | ICD-10-CM

## 2023-02-25 ENCOUNTER — Encounter: Payer: Self-pay | Admitting: Family

## 2023-02-25 ENCOUNTER — Ambulatory Visit: Payer: BC Managed Care – PPO | Admitting: Family

## 2023-02-25 ENCOUNTER — Ambulatory Visit (INDEPENDENT_AMBULATORY_CARE_PROVIDER_SITE_OTHER): Payer: BC Managed Care – PPO

## 2023-02-25 VITALS — BP 123/85 | HR 74 | Temp 97.1°F | Ht 65.0 in | Wt 171.4 lb

## 2023-02-25 DIAGNOSIS — M5442 Lumbago with sciatica, left side: Secondary | ICD-10-CM

## 2023-02-25 DIAGNOSIS — Z23 Encounter for immunization: Secondary | ICD-10-CM

## 2023-02-25 DIAGNOSIS — G8929 Other chronic pain: Secondary | ICD-10-CM

## 2023-02-25 MED ORDER — PREDNISONE 10 MG (21) PO TBPK: ORAL_TABLET | ORAL | 0 refills | Status: DC

## 2023-02-25 MED ORDER — DICLOFENAC SODIUM 75 MG PO TBEC: 75.0000 mg | DELAYED_RELEASE_TABLET | Freq: Two times a day (BID) | ORAL | 2 refills | Status: DC

## 2023-02-25 MED ORDER — BACLOFEN 10 MG PO TABS
10.0000 mg | ORAL_TABLET | Freq: Three times a day (TID) | ORAL | 0 refills | Status: DC
Start: 2023-02-25 — End: 2023-07-02

## 2023-02-25 NOTE — Patient Instructions (Signed)

## 2023-02-25 NOTE — Progress Notes (Signed)
Subjective:    Patient ID: Sierra Pittman, female    DOB: 1971-01-10, 52 y.o.   MRN: [id number]  Chief Complaint  Patient presents with   Back Pain    Mid back lower back runs down both legs locks back up    Back Pain This is a recurrent problem. The current episode started 1 to 4 weeks ago. The problem occurs constantly. The problem is unchanged. The pain is present in the lumbar spine and gluteal. The quality of the pain is described as aching. The pain radiates to the left thigh. The pain is at a severity of 2/10 (when spasm it can be a 8 out 10). The pain is moderate. The symptoms are aggravated by twisting. Associated symptoms include leg pain and numbness. She has tried bed rest and NSAIDs for the symptoms. The treatment provided mild relief.      Review of Systems  Musculoskeletal:  Positive for back pain.  Neurological:  Positive for numbness.  All other systems reviewed and are negative.      Objective:   Physical Exam Vitals reviewed.  Constitutional:      General: She is not in acute distress.    Appearance: She is well-developed.  HENT:     Head: Normocephalic and atraumatic.  Eyes:     Pupils: Pupils are equal, round, and reactive to light.  Neck:     Thyroid: No thyromegaly.  Cardiovascular:     Rate and Rhythm: Normal rate and regular rhythm.     Heart sounds: Normal heart sounds. No murmur heard. Pulmonary:     Effort: Pulmonary effort is normal. No respiratory distress.     Breath sounds: Normal breath sounds. No wheezing.  Abdominal:     General: Bowel sounds are normal. There is no distension.     Palpations: Abdomen is soft.     Tenderness: There is no abdominal tenderness.  Musculoskeletal:        General: No tenderness. Normal range of motion.     Cervical back: Normal range of motion and neck supple.  Skin:    General: Skin is warm and dry.  Neurological:     Mental Status: She is alert and oriented to person, place, and time.     Cranial  Nerves: No cranial nerve deficit.     Deep Tendon Reflexes: Reflexes are normal and symmetric.  Psychiatric:        Behavior: Behavior normal.        Thought Content: Thought content normal.        Judgment: Judgment normal.      BP 123/85   Pulse 74   Temp (!) 97.1 F (36.2 C) (Temporal)   Ht 5\' 5"  (1.651 m)   Wt 171 lb 6.4 oz (77.7 kg)   LMP  (LMP Unknown)   SpO2 95%   BMI 28.52 kg/m       Assessment & Plan:  Chatham Brea comes in today with chief complaint of Back Pain (Mid back lower back runs down both legs locks back up)   Diagnosis and orders addressed:  1. Encounter for immunization - Flu vaccine trivalent PF, 6mos and older(Flulaval,Afluria,Fluarix,Fluzone)  2. Chronic bilateral low back pain with left-sided sciatica Rest ROM exercises  Diclofenac BID with food- no other NSAID's  Balofen as needed- Sedation precautions  - predniSONE (STERAPRED UNI-PAK 21 TAB) 10 MG (21) TBPK tablet; Use as directed  Dispense: 21 tablet; Refill: 0 - diclofenac (VOLTAREN) 75 MG EC tablet; Take  1 tablet (75 mg total) by mouth 2 (two) times daily.  Dispense: 60 tablet; Refill: 2 - baclofen (LIORESAL) 10 MG tablet; Take 1 tablet (10 mg total) by mouth 3 (three) times daily.  Dispense: 30 each; Refill: 0 - DG Lumbar Spine 2-3 Views     Jannifer Rodney, FNP

## 2023-03-05 ENCOUNTER — Telehealth: Payer: Self-pay | Admitting: Family Medicine

## 2023-03-05 NOTE — Telephone Encounter (Signed)
Left message that xray is not back yet

## 2023-03-18 ENCOUNTER — Other Ambulatory Visit: Payer: Self-pay | Admitting: Family

## 2023-03-18 DIAGNOSIS — G8929 Other chronic pain: Secondary | ICD-10-CM

## 2023-03-21 ENCOUNTER — Ambulatory Visit (INDEPENDENT_AMBULATORY_CARE_PROVIDER_SITE_OTHER): Payer: BC Managed Care – PPO | Admitting: Orthopaedic Surgery

## 2023-03-21 DIAGNOSIS — M545 Low back pain, unspecified: Secondary | ICD-10-CM | POA: Diagnosis not present

## 2023-03-21 DIAGNOSIS — G8929 Other chronic pain: Secondary | ICD-10-CM

## 2023-03-21 NOTE — Progress Notes (Signed)
Office Visit Note   Patient: Sierra Pittman           Date of Birth: Jul 20, 1970           MRN: [id number] Visit Date: 03/21/2023              Requested by: Junie Spencer, FNP [address] [place],  [place] [address] PCP: Raliegh Ip, DO   Assessment & Plan: Visit Diagnoses:  1. Chronic low back pain, unspecified back pain laterality, unspecified whether sciatica present     Plan: Reviewed x-rays will set up for some physical therapy for degenerative changes evaluation and treatment, core strengthening.  Recheck 6 weeks if she is having ongoing symptoms we will consider diagnostic imaging.  Follow-Up Instructions: No follow-ups on file.   Orders:  No orders of the defined types were placed in this encounter.  No orders of the defined types were placed in this encounter.     Procedures: No procedures performed   Clinical Data: No additional findings.   Subjective: No chief complaint on file.   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  Pain is mid lower back sometimes some radicular component primarily back pain.  X-rays 03/16/2023 showed some multilevel degenerative changes L1 and L3 minimal compression possibly some osteopenia.  Degenerative spurring noted more on the right than left side.  She states she typically has more discomfort right than left  No bone density test on record.  She does have a father is in therapy currently for his back.  Review of Systems no fever chills no associated bowel or bladder symptoms.   Objective: Vital Signs: LMP  (LMP Unknown)   Physical Exam Constitutional:      Appearance: She is well-developed.  HENT:     Head: Normocephalic.     Right Ear: External ear normal.     Left Ear: External ear normal. There is no impacted cerumen.  Eyes:     Pupils: Pupils are equal, round, and reactive to light.  Neck:     Thyroid: No thyromegaly.     Trachea:  No tracheal deviation.  Cardiovascular:     Rate and Rhythm: Normal rate.  Pulmonary:     Effort: Pulmonary effort is normal.  Abdominal:     Palpations: Abdomen is soft.  Musculoskeletal:     Cervical back: No rigidity.  Skin:    General: Skin is warm and dry.  Neurological:     Mental Status: She is alert and oriented to person, place, and time.  Psychiatric:        Behavior: Behavior normal.     Ortho Exam negative logroll reflexes are intact she is able to heel and toe walk tenderness lumbosacral spine minimal sciatic notch tenderness minimal trochanteric bursal tenderness.  Specialty Comments:  No specialty comments available.  Imaging: Narrative & Impression  CLINICAL DATA:  Pain.   EXAM: LUMBAR SPINE - 2 VIEW   COMPARISON:  None Available.   FINDINGS: Osseous structures are osteopenic mild anterior wedge compression deformity L1 and L3 which are age indeterminate findings. Consider MRI for further evaluation. Multilevel disc space narrowing with marginal osteophytes consistent with degenerative disc disease. No spondylolisthesis. No focal osteolytic or osteoblastic lesions.   IMPRESSION: Multilevel degenerative changes. Age-indeterminate L1 and L3 mild compression deformities. Osteopenia.     Electronically Signed   By: Layla Maw M.D.   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   Elevated BP without diagnosis of hypertension 06/24/2018   Migraine 03/30/2016   Bunion 03/30/2016   Past Medical History:  Diagnosis Date   Allergy    Anxiety    Arthritis    Colon polyps    COVID-19    Depression    Gestational diabetes    Hypertension    IBS (irritable bowel syndrome)    Pneumonia     Family History  Problem Relation Age of Onset    Hyperlipidemia Mother    Hypertension Father    Diabetes Father    Hyperlipidemia Father    Dementia Maternal Grandmother    Prostate cancer Maternal Grandfather    Hypertension Paternal Grandmother    Dementia Paternal Grandfather    Hyperlipidemia Brother    Colon polyps Brother    Hyperlipidemia Brother    Hyperlipidemia Brother    Colon cancer Neg Hx    Esophageal cancer Neg Hx     Past Surgical History:  Procedure Laterality Date   COLONOSCOPY  07/13/2019   Arlyn Dunning. Done in [place]   ESOPHAGOGASTRODUODENOSCOPY     Around 2016 in [place] as well. had a hiatal henia a lot of acid and possibly h. 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

## 2023-03-22 DIAGNOSIS — M545 Other chronic pain: Secondary | ICD-10-CM | POA: Insufficient documentation

## 2023-05-09 ENCOUNTER — Encounter: Payer: Self-pay | Admitting: Orthopaedic Surgery

## 2023-05-09 ENCOUNTER — Ambulatory Visit (INDEPENDENT_AMBULATORY_CARE_PROVIDER_SITE_OTHER): Payer: BC Managed Care – PPO | Admitting: Orthopaedic Surgery

## 2023-05-09 DIAGNOSIS — G8929 Other chronic pain: Secondary | ICD-10-CM | POA: Diagnosis not present

## 2023-05-09 DIAGNOSIS — M545 Low back pain, unspecified: Secondary | ICD-10-CM | POA: Diagnosis not present

## 2023-05-09 NOTE — Progress Notes (Signed)
Office Visit Note   Patient: Sierra Pittman           Date of Birth: 25-Feb-1971           MRN: [id number] Visit Date: 05/09/2023              Requested by: Raliegh Ip, DO [address] [place],  [place] [address] PCP: Raliegh Ip, DO   Assessment & Plan: Visit Diagnoses:  1. Chronic low back pain, unspecified back pain laterality, unspecified whether sciatica present     Plan: Patient to continue therapy we will proceed with MRI scan for evaluation chronic low back pain with L1 and L3 wedging.  Follow-up after MRI.  Follow-Up Instructions: No follow-ups on file.   Orders:  Orders Placed This Encounter  Procedures   MR Lumbar Spine w/o contrast   No orders of the defined types were placed in this encounter.     Procedures: No procedures performed   Clinical Data: No additional findings.   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  She has been going to therapy has gotten improvement in strength and states many of the exercises she cannot do which she originally could not.  She thinks she has gotten some improvement but still has chronic low back pain.  Previous radiographs had shown wedging of L1 and L3 without specific traumatic history.  She has back pain that radiates into her buttocks and recently one of the children bumped into her she had increased pain and feels like she "tweaked" her back.  Review of Systems all other systems are noncontributory to HPI.   Objective: Vital Signs: LMP  (LMP Unknown)   Physical Exam Constitutional:      Appearance: She is well-developed.  HENT:     Head: Normocephalic.     Right Ear: External ear normal.     Left Ear: External ear normal. There is no impacted cerumen.  Eyes:     Pupils: Pupils are equal, round, and reactive to light.  Neck:     Thyroid: No thyromegaly.      Trachea: No tracheal deviation.  Cardiovascular:     Rate and Rhythm: Normal rate.  Pulmonary:     Effort: Pulmonary effort is normal.  Abdominal:     Palpations: Abdomen is soft.  Musculoskeletal:     Cervical back: No rigidity.  Skin:    General: Skin is warm and dry.  Neurological:     Mental Status: She is alert and oriented to person, place, and time.  Psychiatric:        Behavior: Behavior normal.     Ortho Exam negative logroll hips knees reach full extension some pain with sciatic notch palpation both right and left discomfort with forward flexion.  Knee and ankle jerk are intact.  Normal pedal pulses.  Specialty Comments:  No specialty comments available.  Imaging: No results found.   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   Elevated BP without diagnosis of hypertension 06/24/2018   Migraine 03/30/2016   Bunion 03/30/2016   Past Medical History:  Diagnosis Date   Allergy    Anxiety  Arthritis    Colon polyps    COVID-19    Depression    Gestational diabetes    Hypertension    IBS (irritable bowel syndrome)    Pneumonia     Family History  Problem Relation Age of Onset   Hyperlipidemia Mother    Hypertension Father    Diabetes Father    Hyperlipidemia Father    Dementia Maternal Grandmother    Prostate cancer Maternal Grandfather    Hypertension Paternal Grandmother    Dementia Paternal Grandfather    Hyperlipidemia Brother    Colon polyps Brother    Hyperlipidemia Brother    Hyperlipidemia Brother    Colon cancer Neg Hx    Esophageal cancer Neg Hx     Past Surgical History:  Procedure Laterality Date   COLONOSCOPY  07/13/2019   Arlyn Dunning. [place] in [place]   ESOPHAGOGASTRODUODENOSCOPY     Around 2016 in [place] as well. had a hiatal henia a lot  of acid and possibly h. 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

## 2023-06-06 ENCOUNTER — Ambulatory Visit (INDEPENDENT_AMBULATORY_CARE_PROVIDER_SITE_OTHER): Payer: 59 | Admitting: Orthopaedic Surgery

## 2023-06-06 ENCOUNTER — Encounter: Payer: Self-pay | Admitting: Orthopaedic Surgery

## 2023-06-06 VITALS — Ht 65.0 in | Wt 171.0 lb

## 2023-06-06 DIAGNOSIS — M5126 Other intervertebral disc displacement, lumbar region: Secondary | ICD-10-CM | POA: Insufficient documentation

## 2023-06-06 NOTE — Progress Notes (Signed)
 Office Visit Note   Patient: Sierra Pittman           Date of Birth: 05-24-1971           MRN: [id number] Visit Date: 06/06/2023              Requested by: Jolinda Norene HERO, DO [address] [place],  [place] [address] PCP: Jolinda Norene HERO, DO   Assessment & Plan: Visit Diagnoses:  1. Protrusion of lumbar intervertebral disc     Plan: Patient is better but has not been as active since she has been on break.  She will call if she would like to proceed with single epidural for her symptoms.  She does have disc protrusion L5-S1 right paracentral with some involvement on the left but still has fairly significant canal AP diameter 14 mm and no central compression.  Follow-Up Instructions: No follow-ups on file.   Orders:  No orders of the defined types were placed in this encounter.  No orders of the defined types were placed in this encounter.     Procedures: No procedures performed   Clinical Data: No additional findings.   Subjective: Chief Complaint  Patient presents with   Lower Back - Pain, Follow-up    MRI review    HPI 53 year old female recurrent returns with acute on chronic low back pain.  Recently she has had a little bit more symptoms on the left than right but most the time it has been on the right versus left.  Her symptoms been better since she has been off.  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  No associated bowel symptoms.  She had some problems after childbirth with some loss of bladder control was not been through any physical therapy.  No perineum numbness.  No myelopathic gait problems.  Lumbar MRI scan has been obtained and is available for review listed below.  Review of Systems all systems updated unchanged from previous visit.   Objective: Vital Signs: Ht 5' 5 (1.651 m)   Wt 171 lb (77.6 kg)   LMP  (LMP Unknown)   BMI 28.46 kg/m   Physical Exam Constitutional:      Appearance: She  is well-developed.  HENT:     Head: Normocephalic.     Right Ear: External ear normal.     Left Ear: External ear normal. There is no impacted cerumen.  Eyes:     Pupils: Pupils are equal, round, and reactive to light.  Neck:     Thyroid: No thyromegaly.     Trachea: No tracheal deviation.  Cardiovascular:     Rate and Rhythm: Normal rate.  Pulmonary:     Effort: Pulmonary effort is normal.  Abdominal:     Palpations: Abdomen is soft.  Musculoskeletal:     Cervical back: No rigidity.  Skin:    General: Skin is warm and dry.  Neurological:     Mental Status: She is alert and oriented to person, place, and time.  Psychiatric:        Behavior: Behavior normal.     Ortho Exam patient has tenderness lumbosacral junction and more sciatic notch tenderness left and right versus trochanters.  Negative logroll hips some pain with straight leg raising 90 degrees.  She is able to heel and toe walk no rash over exposed skin no weakness.  Specialty Comments:  No specialty comments available.  Imaging: This result has an attachment  that is not available. CLINICAL DATA:  Chronic low back pain LBP on and off for months, pain radiates down both legs, tingling legs, NKI  EXAM: MRI LUMBAR SPINE WITHOUT CONTRAST  TECHNIQUE: Multiplanar, multisequence MR imaging of the lumbar spine was performed. No intravenous contrast was administered.  COMPARISON:  None Available.  FINDINGS: Segmentation:  Standard.  Alignment:  Physiologic.  Vertebrae:  No fracture, evidence of discitis, or bone lesion.  Conus medullaris and cauda equina: Conus extends to the L1 level. Conus and cauda equina appear normal.  Paraspinal and other soft tissues: Negative  Disc levels:  T12-L1: Only imaged in the sagittal plane. No evidence of high-grade spinal canal or neural foraminal stenosis. There is a minimal disc bulge at this level.  L1-L2: Mild bilateral facet degenerative change. Small disc  bulge. No significant spinal canal or neural foraminal narrowing.  L2-L3: Mild bilateral facet degenerative change. Minimal disc bulge. Mild spinal canal narrowing. No neural foraminal narrowing.  L3-L4: Mild bilateral facet degenerative change. No significant disc bulge. No spinal canal narrowing. No significant neural foraminal narrowing.  L4-L5: Mild bilateral facet degenerative change. No significant disc bulge. No spinal canal narrowing. Mild to moderate bilateral neural foraminal narrowing.  L5-S1: Right paracentral disc extrusion with narrowing of the right lateral recess. No significant spinal canal narrowing. Mild-to-moderate bilateral neural foraminal narrowing. Procedure Note  Meade Lyndall Haws, MD - 05/28/2023 Formatting of this note might be different from the original. CLINICAL DATA:  Chronic low back pain LBP on and off for months, pain radiates down both legs, tingling legs, NKI  EXAM: MRI LUMBAR SPINE WITHOUT CONTRAST  TECHNIQUE: Multiplanar, multisequence MR imaging of the lumbar spine was performed. No intravenous contrast was administered.  COMPARISON:  None Available.  FINDINGS: Segmentation:  Standard.  Alignment:  Physiologic.  Vertebrae:  No fracture, evidence of discitis, or bone lesion.  Conus medullaris and cauda equina: Conus extends to the L1 level. Conus and cauda equina appear normal.  Paraspinal and other soft tissues: Negative  Disc levels:  T12-L1: Only imaged in the sagittal plane. No evidence of high-grade spinal canal or neural foraminal stenosis. There is a minimal disc bulge at this level.  L1-L2: Mild bilateral facet degenerative change. Small disc bulge. No significant spinal canal or neural foraminal narrowing.  L2-L3: Mild bilateral facet degenerative change. Minimal disc bulge. Mild spinal canal narrowing. No neural foraminal narrowing.  L3-L4: Mild bilateral facet degenerative change. No significant disc bulge. No  spinal canal narrowing. No significant neural foraminal narrowing.  L4-L5: Mild bilateral facet degenerative change. No significant disc bulge. No spinal canal narrowing. Mild to moderate bilateral neural foraminal narrowing.  L5-S1: Right paracentral disc extrusion with narrowing of the right lateral recess. No significant spinal canal narrowing. Mild-to-moderate bilateral neural foraminal narrowing.  IMPRESSION: 1. Right paracentral disc extrusion at L5-S1 with mild narrowing of the right lateral recess. 2. Mild-to-moderate bilateral neural foraminal narrowing at L4-L5 and L5-S1.   Electronically Signed   By: Lyndall Meade M.D.   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   Elevated BP without diagnosis of hypertension 06/24/2018   Migraine 03/30/2016   Bunion  03/30/2016   Past Medical History:  Diagnosis Date   Allergy    Anxiety    Arthritis    Colon polyps    COVID-19    Depression    Gestational diabetes    Hypertension    IBS (irritable bowel syndrome)    Pneumonia     Family History  Problem Relation Age of Onset   Hyperlipidemia Mother    Hypertension Father    Diabetes Father    Hyperlipidemia Father    Dementia Maternal Grandmother    Prostate cancer Maternal Grandfather    Hypertension Paternal Grandmother    Dementia Paternal Grandfather    Hyperlipidemia Brother    Colon polyps Brother    Hyperlipidemia Brother    Hyperlipidemia Brother    Colon cancer Neg Hx    Esophageal cancer Neg Hx     Past Surgical History:  Procedure Laterality Date   COLONOSCOPY  07/13/2019   Duwaine Jumper. Done in [place]    ESOPHAGOGASTRODUODENOSCOPY     Around 2016 in [place] as well. had a hiatal henia a lot of acid and possibly h. 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

## 2023-07-02 ENCOUNTER — Encounter: Payer: Self-pay | Admitting: Family Medicine

## 2023-07-02 ENCOUNTER — Telehealth: Payer: Self-pay | Admitting: Family Medicine

## 2023-07-02 DIAGNOSIS — U071 COVID-19: Secondary | ICD-10-CM | POA: Diagnosis not present

## 2023-07-02 NOTE — Patient Instructions (Signed)

## 2023-07-02 NOTE — Progress Notes (Signed)
MyChart Video visit  Subjective: CC: COVID-19 PCP: Raliegh Ip, DO WUJ:[id number] Contino is a 53 y.o. female. Patient provides verbal consent for consult held via video.  Due to COVID-19 pandemic this visit was conducted virtually. This visit type was conducted due to national recommendations for restrictions regarding the COVID-19 Pandemic (e.g. social distancing, sheltering in place) in an effort to limit this patient's exposure and mitigate transmission in our community. All issues noted in this document were discussed and addressed.  A physical exam was not performed with this format.   Location of patient: home Location of provider: [facility] Others present for call: none  1.  COVID-19 She reports fever over the weekend.  She tested positive a few days ago.  She reports it's not that bad right now.  Overnight she seems be doing a little better today. Her energy is good.  Was not sure what she could take safely.  Reports no hemoptysis, shortness of breath, wheezing.  ROS: Per HPI  Allergies  Allergen Reactions   Amoxicillin Rash   Histamine Nausea And Vomiting   Penicillins Rash   Past Medical History:  Diagnosis Date   Allergy    Anxiety    Arthritis    Colon polyps    COVID-19    Depression    Gestational diabetes    Hypertension    IBS (irritable bowel syndrome)    Pneumonia     Current Outpatient Medications:    cholecalciferol (VITAMIN D3) 25 MCG (1000 UNIT) tablet, Take 1,000 Units by mouth daily., Disp: , Rfl:    olopatadine (PATANOL) 0.1 % ophthalmic solution, Place 1 drop into both eyes 2 (two) times daily., Disp: 5 mL, Rfl: 12   SUMAtriptan (IMITREX) 100 MG tablet, Take 100 mg by mouth as needed. , Disp: , Rfl:    valACYclovir (VALTREX) 500 MG tablet, Take 500 mg by mouth as needed., Disp: , Rfl:   Gen: nontoxic appearing female, NAD HEENT: no rhinorrhea Pulm: normal WOB on room air, no observed coughing, wheezing or dyspnea with speech  Assessment/  Plan: 53 y.o. female   COVID-19  Discussed supportive care.  She will use nasal saline, humidification, oral antihistamine and Mucinex.  Discussed secondary bacterial infection signs and symptoms and reasons for reevaluation.  She voiced good understanding and will follow-up as needed.  Work note provided  Start time: 9:37a End time: 9:43a  Total time spent on patient care (including video visit/ documentation): 8 minutes  Raliegh Ip, DO [facility] [phone]

## 2023-08-08 ENCOUNTER — Other Ambulatory Visit: Payer: Self-pay | Admitting: Family Medicine

## 2023-08-08 DIAGNOSIS — E559 Vitamin D deficiency, unspecified: Secondary | ICD-10-CM

## 2023-08-09 ENCOUNTER — Ambulatory Visit: Payer: BC Managed Care – PPO | Admitting: Family Medicine

## 2023-08-09 ENCOUNTER — Encounter: Payer: Self-pay | Admitting: Family Medicine

## 2023-08-09 VITALS — BP 103/70 | HR 82 | Temp 98.5°F | Ht 65.0 in | Wt 170.2 lb

## 2023-08-09 DIAGNOSIS — Z0000 Encounter for general adult medical examination without abnormal findings: Secondary | ICD-10-CM

## 2023-08-09 DIAGNOSIS — D696 Thrombocytopenia, unspecified: Secondary | ICD-10-CM

## 2023-08-09 DIAGNOSIS — E559 Vitamin D deficiency, unspecified: Secondary | ICD-10-CM

## 2023-08-09 DIAGNOSIS — G43009 Migraine without aura, not intractable, without status migrainosus: Secondary | ICD-10-CM | POA: Diagnosis not present

## 2023-08-09 DIAGNOSIS — Z91018 Allergy to other foods: Secondary | ICD-10-CM | POA: Diagnosis not present

## 2023-08-09 DIAGNOSIS — Z0001 Encounter for general adult medical examination with abnormal findings: Secondary | ICD-10-CM

## 2023-08-09 DIAGNOSIS — G500 Trigeminal neuralgia: Secondary | ICD-10-CM

## 2023-08-09 DIAGNOSIS — Z23 Encounter for immunization: Secondary | ICD-10-CM | POA: Diagnosis not present

## 2023-08-09 DIAGNOSIS — E782 Mixed hyperlipidemia: Secondary | ICD-10-CM | POA: Diagnosis not present

## 2023-08-09 DIAGNOSIS — J3089 Other allergic rhinitis: Secondary | ICD-10-CM

## 2023-08-09 MED ORDER — DESLORATADINE 5 MG PO TABS: 5.0000 mg | ORAL_TABLET | Freq: Every day | ORAL | 3 refills | Status: AC

## 2023-08-09 MED ORDER — NURTEC 75 MG PO TBDP: 1.0000 | ORAL_TABLET | Freq: Every day | ORAL | Status: DC | PRN

## 2023-08-09 NOTE — Progress Notes (Signed)
 Sierra Pittman is a 53 y.o. female presents to office today for annual physical exam examination.    Concerns today include: 1.  Migraine headaches She has essentially weaned herself from use of Imitrex.  She was worried that she was having rebound headaches.  She still gets an occasional severe headache that is not relieved by ibuprofen.  She tries her best to avoid excessive use of ibuprofen due to underlying GI issues which overall have gotten better.  She is interested in trying Nurtec again.  She also reports some occasional electric type zaps that go across her scalp on the right-hand side.  This occurs intermittently but sometimes can last several days.  Is not relieved by anything.  She reports no associated facial numbness, visual disturbance or other neurologic changes.  2.  Possible allergy She reports possible allergy.  She ate something that had a little spice in it recently.  She has been trying to gradually add things that are nonbland since her GI symptoms have gotten better.  She notes that she had what felt like oral irritation and swelling and then after she drinks some water it seemed to get a little bit better but at 1 point she thought she may need to stop driving because of the symptoms.  Wondering if perhaps she might have some type of allergy.  She has had allergy testing in the past but is not sure which foods were actually tested.  Additionally, wondering what she should use for seasonal allergies in general.  Not currently utilizing anything.  3.  Vaginal prolapse She will be seeing her OB/GYN soon to discuss vaginal prolapse and urinary incontinence associated with this.  She is considering seeing a pelvic floor physical therapist   There are no preventive care reminders to display for this patient. Refills needed today: none  Immunization History  Administered Date(s) Administered   Influenza, Seasonal, Injecte, Preservative Fre 02/25/2023   Influenza,inj,Quad  PF,6+ Mos 03/05/2016, 04/26/2022   PFIZER(Purple Top)SARS-COV-2 Vaccination 08/09/2019, 08/30/2019   Td 01/11/2011   Td (Adult),5 Lf Tetanus Toxid, Preservative Free 07/16/1996   Tdap 01/11/2011   Zoster Recombinant(Shingrix) 01/19/2021, 09/11/2021   Past Medical History:  Diagnosis Date   Allergy    Anxiety    Arthritis    Colon polyps    COVID-19    Depression    Gestational diabetes    Hypertension    IBS (irritable bowel syndrome)    Pneumonia    Social History   Socioeconomic History   Marital status: Divorced    Spouse name: Not on file   Number of children: 2   Years of education: Not on file   Highest education level: Not on file  Occupational History   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 Has 1 dog. Drinks  3 diet sodas a day. Exercises 3-5x a week at the gym. Has a bachelors degree. 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   Social Drivers of Corporate investment banker Strain: Not on file  Food Insecurity: Not on file  Transportation Needs: Not on file  Physical Activity: Not on file  Stress: Not on file  Social Connections: Not on file  Intimate Partner Violence: Not At Risk (12/10/2022)   Received from [facility]   Humiliation, Afraid, Rape, and Kick questionnaire    Fear of Current or Ex-Partner: No    Emotionally Abused: No    Physically Abused: No    Sexually Abused: No   Past Surgical  History:  Procedure Laterality Date   COLONOSCOPY  07/13/2019   Arlyn Dunning. Done in [place]   ESOPHAGOGASTRODUODENOSCOPY     Around 2016 in [place] as well. had a hiatal henia a lot of acid and possibly h. pylori   Family History  Problem Relation Age of Onset   Hyperlipidemia Mother    Hypertension Father    Diabetes Father    Hyperlipidemia Father    Dementia Maternal Grandmother    Prostate cancer Maternal Grandfather    Hypertension Paternal Grandmother    Dementia Paternal Grandfather    Hyperlipidemia Brother    Colon polyps Brother    Hyperlipidemia Brother    Hyperlipidemia Brother    Colon cancer Neg Hx    Esophageal cancer Neg Hx     Current Outpatient Medications:    olopatadine (PATANOL) 0.1 % ophthalmic solution, Place 1 drop into both eyes 2 (two) times daily., Disp: 5 mL, Rfl: 12   valACYclovir (VALTREX) 500 MG tablet, Take 500 mg by mouth as needed., Disp: , Rfl:   Allergies  Allergen Reactions   Amoxicillin Rash   Histamine Nausea And Vomiting   Penicillins Rash     ROS: Review of Systems Pertinent items noted in HPI and remainder of comprehensive ROS otherwise negative.    Physical exam BP 103/70   Pulse 82   Temp 98.5 F (36.9 C)   Ht 5\' 5"  (1.651 m)   Wt 170 lb 3.2 oz (77.2 kg)   LMP  (LMP Unknown)   SpO2 95%   BMI 28.32 kg/m  General appearance: alert, cooperative, appears stated age, and no distress Head: Normocephalic, without obvious abnormality, atraumatic Eyes: negative findings: lids and lashes normal, conjunctivae and sclerae normal, corneas clear, and pupils equal, round, reactive to light and accomodation Ears: normal TM's and external ear canals both ears Nose: Nares normal. Septum midline. Mucosa normal. No drainage or sinus tenderness. Throat: lips, mucosa, and tongue normal; teeth and gums normal Neck: no adenopathy, supple, symmetrical, trachea midline, and thyroid not enlarged, symmetric, no tenderness/mass/nodules Back:  symmetric, no curvature. ROM normal. No CVA tenderness. Lungs: clear to auscultation bilaterally Heart: regular rate and rhythm, S1, S2 normal, no murmur, click, rub or gallop Abdomen: soft, non-tender; bowel sounds normal; no masses,  no organomegaly and obese Extremities: extremities normal, atraumatic, no cyanosis or edema Pulses: 2+ and symmetric Skin: Skin color, texture, turgor normal. No rashes or lesions Lymph nodes: Cervical, supraclavicular, and axillary nodes normal. Neurologic: Grossly normal      08/09/2023    3:31 PM 01/14/2023    2:56 PM 08/07/2022    3:47 PM  Depression screen PHQ 2/9  Decreased Interest 1 0 0  Down, Depressed, Hopeless 0 0 0  PHQ - 2 Score 1 0 0  Altered sleeping 2  1 0  Tired, decreased energy 2 0 1  Change in appetite 1 0 0  Feeling bad or failure about yourself  0 0 1  Trouble concentrating 1 0 0  Moving slowly or fidgety/restless 1  0  Suicidal thoughts 0 0 0  PHQ-9 Score 8 1 2   Difficult doing work/chores Somewhat difficult Not difficult at all Not difficult at all      08/09/2023    3:31 PM 01/14/2023    2:57 PM 08/07/2022    3:48 PM 01/19/2021    9:43 AM  GAD 7 : Generalized Anxiety Score  Nervous, Anxious, on Edge 1 1 0 1  Control/stop worrying 0 0 1 1  Worry too much - different things 1 0 1 1  Trouble relaxing 1 1 1 2   Restless 1 0 0 1  Easily annoyed or irritable 1 0 1 1  Afraid - awful might happen 1 0 0 0  Total GAD 7 Score 6 2 4 7   Anxiety Difficulty  Not difficult at all Somewhat difficult Not difficult at all     Assessment/ Plan: Sierra Pittman here for annual physical exam.   Annual physical exam - Plan: Tdap vaccine greater than or equal to 7yo IM  Vitamin D deficiency - Plan: VITAMIN D 25 Hydroxy (Vit-D Deficiency, Fractures), VITAMIN D 25 Hydroxy (Vit-D Deficiency, Fractures), DG [facility] DEXA  Mixed hyperlipidemia - Plan: CMP14+EGFR, Lipid panel, TSH, CMP14+EGFR, Lipid panel, TSH  Decreased platelet count (HCC) - Plan:  CBC, CBC  Food allergy - Plan: Jalapeno Pepper, IgE, Allergen Ara Kussmaul Pepper IgE, Allergen,Chili Pepper,Rf279, desloratadine (CLARINEX) 5 MG tablet  Migraine without aura and without status migrainosus, not intractable - Plan: Rimegepant Sulfate (NURTEC) 75 MG TBDP  Non-seasonal allergic rhinitis, unspecified trigger - Plan: desloratadine (CLARINEX) 5 MG tablet  Trigeminal neuralgia  Tetanus shot updated today.  She will follow-up with OB/GYN for pelvic and breast exam  I have ordered fasting labs. She is to schedule DEXA scan at some point as well  Will plan to check CBC given history of platelet abnormalities.  Have also ordered Pepper testing via blood.  We discussed consideration for reevaluation with allergy if these are unrevealing.  Nurtec samples were provided.  She will message me via MyChart if these are efficacious and I will prescribe.  Cautioned to use only 1/day as needed  Clarinex given for allergies.  What she describes sounds like trigeminal neuralgia.  Handout provided.  She is welcome to contact me should she have another flareup that last several days and we can consider trial of gabapentin.  We discussed potential etiologies of this including inflammatory etiology versus compressive versus malignancy.  Given intermittent and nonsustained symptomology I think the latter to be very unlikely.  Her neurologic exam is unremarkable  Counseled on healthy lifestyle choices, including diet (rich in fruits, vegetables and lean meats and low in salt and simple carbohydrates) and exercise (at least 30 minutes of moderate physical activity daily).  Patient to follow up 1 year for CPE  Sierra Pittman M. Nadine Counts, DO

## 2023-08-10 ENCOUNTER — Encounter: Payer: Self-pay | Admitting: Family Medicine

## 2023-08-10 LAB — CMP14+EGFR
ALT: 12 IU/L (ref 0–32)
AST: 22 IU/L (ref 0–40)
Albumin: 4.4 g/dL (ref 3.8–4.9)
Alkaline Phosphatase: 69 IU/L (ref 44–121)
BUN/Creatinine Ratio: 18 (ref 9–23)
BUN: 14 mg/dL (ref 6–24)
Bilirubin Total: 0.7 mg/dL (ref 0.0–1.2)
CO2: 23 mmol/L (ref 20–29)
Calcium: 9.5 mg/dL (ref 8.7–10.2)
Chloride: 102 mmol/L (ref 96–106)
Creatinine, Ser: 0.78 mg/dL (ref 0.57–1.00)
Globulin, Total: 2.7 g/dL (ref 1.5–4.5)
Glucose: 77 mg/dL (ref 70–99)
Potassium: 3.8 mmol/L (ref 3.5–5.2)
Sodium: 141 mmol/L (ref 134–144)
Total Protein: 7.1 g/dL (ref 6.0–8.5)
eGFR: 90 mL/min/{1.73_m2} (ref >59)

## 2023-08-10 LAB — CBC
Hematocrit: 40.1 % (ref 34.0–46.6)
Hemoglobin: 14 g/dL (ref 11.1–15.9)
MCH: 30.6 pg (ref 26.6–33.0)
MCHC: 34.9 g/dL (ref 31.5–35.7)
MCV: 88 fL (ref 79–97)
Platelets: 207 10*3/uL (ref 150–450)
RBC: 4.58 x10E6/uL (ref 3.77–5.28)
RDW: 12.6 % (ref 11.7–15.4)
WBC: 6.6 10*3/uL (ref 3.4–10.8)

## 2023-08-10 LAB — VITAMIN D 25 HYDROXY (VIT D DEFICIENCY, FRACTURES): Vit D, 25-Hydroxy: 39 ng/mL (ref 30.0–100.0)

## 2023-08-10 LAB — LIPID PANEL
Chol/HDL Ratio: 3.5 ratio (ref 0.0–4.4)
Cholesterol, Total: 214 mg/dL — ABNORMAL HIGH (ref 100–199)
HDL: 61 mg/dL (ref >39)
LDL Chol Calc (NIH): 119 mg/dL — ABNORMAL HIGH (ref 0–99)
Triglycerides: 198 mg/dL — ABNORMAL HIGH (ref 0–149)
VLDL Cholesterol Cal: 34 mg/dL (ref 5–40)

## 2023-08-10 LAB — TSH: TSH: 1.58 u[IU]/mL (ref 0.450–4.500)

## 2023-08-15 LAB — ALLERGEN,CHILI PEPPER,RF279

## 2023-08-15 LAB — JALAPENO PEPPER, IGE: Class Interpretation: 0

## 2023-08-15 LAB — F263-IGE GREEN BELL PEPPER

## 2023-08-20 ENCOUNTER — Encounter: Payer: Self-pay | Admitting: Family Medicine

## 2023-08-29 ENCOUNTER — Telehealth: Payer: Self-pay | Admitting: Family Medicine

## 2023-08-29 NOTE — Telephone Encounter (Signed)
 Called and made appt for Monday 09/02/2023 @ 430

## 2023-08-29 NOTE — Telephone Encounter (Signed)
 Copied from CRM [id number]. Topic: General - Other >> Aug 29, 2023 12:39 PM Sierra Pittman S wrote: Reason for CRM: Patient would like a callback letting her know the status of her bone density scan and where to get it.   Callback #:  [phone]

## 2023-09-02 ENCOUNTER — Ambulatory Visit (INDEPENDENT_AMBULATORY_CARE_PROVIDER_SITE_OTHER)

## 2023-09-02 DIAGNOSIS — Z780 Asymptomatic menopausal state: Secondary | ICD-10-CM

## 2023-09-02 DIAGNOSIS — E559 Vitamin D deficiency, unspecified: Secondary | ICD-10-CM

## 2023-09-03 ENCOUNTER — Encounter: Payer: Self-pay | Admitting: Family Medicine

## 2023-09-03 DIAGNOSIS — M8580 Other specified disorders of bone density and structure, unspecified site: Secondary | ICD-10-CM | POA: Insufficient documentation

## 2023-09-11 ENCOUNTER — Other Ambulatory Visit: Payer: Self-pay | Admitting: Family Medicine

## 2023-09-11 DIAGNOSIS — T753XXA Motion sickness, initial encounter: Secondary | ICD-10-CM

## 2023-12-11 ENCOUNTER — Encounter: Payer: Self-pay | Admitting: Family Medicine

## 2023-12-11 DIAGNOSIS — G43009 Migraine without aura, not intractable, without status migrainosus: Secondary | ICD-10-CM

## 2023-12-11 MED ORDER — NURTEC 75 MG PO TBDP: 1.0000 | ORAL_TABLET | Freq: Every day | ORAL | 99 refills | Status: AC | PRN

## 2023-12-25 ENCOUNTER — Other Ambulatory Visit (HOSPITAL_BASED_OUTPATIENT_CLINIC_OR_DEPARTMENT_OTHER): Payer: Self-pay

## 2023-12-25 ENCOUNTER — Other Ambulatory Visit (HOSPITAL_COMMUNITY): Payer: Self-pay

## 2023-12-25 ENCOUNTER — Telehealth: Payer: Self-pay

## 2023-12-25 NOTE — Telephone Encounter (Signed)
 Pharmacy Patient Advocate Encounter   Received notification from CoverMyMeds that prior authorization for Nurtec 75 is required/requested.   Insurance verification completed.   The patient is insured through [facility] .   Per test claim: PA required; PA submitted to above mentioned insurance via CoverMyMeds Key/confirmation #/EOC [id number] Status is pending

## 2023-12-25 NOTE — Telephone Encounter (Signed)
 Patient chart notes only show sumatriptan fail.  Pharmacy Patient Advocate Encounter  Received notification from [facility] that Prior Authorization for Nurtec 75 has been DENIED.  Full denial letter will be uploaded to the media tab. See denial reason below.   PA #/Case ID/Reference #: [id number]

## 2023-12-25 NOTE — Telephone Encounter (Signed)
 Please perform a deeper chart review for detailed documentation and resubmit PA for Nurtec. She has been on relpax  back in 2019 and imitrex in the last couple years.

## 2023-12-26 ENCOUNTER — Other Ambulatory Visit (HOSPITAL_COMMUNITY): Payer: Self-pay

## 2023-12-26 ENCOUNTER — Other Ambulatory Visit (HOSPITAL_BASED_OUTPATIENT_CLINIC_OR_DEPARTMENT_OTHER): Payer: Self-pay

## 2023-12-26 ENCOUNTER — Telehealth: Payer: Self-pay

## 2023-12-26 NOTE — Telephone Encounter (Signed)
 Resubmitted with eletriptan  and sumatriptan fail  Pharmacy Patient Advocate Encounter   Received notification from Physician's Office that prior authorization for Nurtec 75 is required/requested.   Insurance verification completed.   The patient is insured through [facility] .   Per test claim: PA required; PA submitted to above mentioned insurance via CoverMyMeds Key/confirmation #/EOC [facility] Status is pending

## 2023-12-26 NOTE — Telephone Encounter (Signed)
 Approved.

## 2023-12-26 NOTE — Telephone Encounter (Signed)
 Pharmacy Patient Advocate Encounter  Received notification from [facility] that Prior Authorization for Nurtec 75 has been APPROVED from 12/26/23 to 12/25/24. Ran test claim, Copay is $0.00. This test claim was processed through [facility]- copay amounts may vary at other pharmacies due to pharmacy/plan contracts, or as the patient moves through the different stages of their insurance plan.   PA #/Case ID/Reference #: [id number]

## 2023-12-26 NOTE — Telephone Encounter (Signed)
 New encounter created for resubmission. Has been approved

## 2024-03-30 ENCOUNTER — Other Ambulatory Visit: Payer: Self-pay | Admitting: Medical Genetics

## 2024-03-30 DIAGNOSIS — Z006 Encounter for examination for normal comparison and control in clinical research program: Secondary | ICD-10-CM

## 2024-04-06 ENCOUNTER — Encounter: Payer: Self-pay | Admitting: Radiology

## 2024-06-09 ENCOUNTER — Other Ambulatory Visit (HOSPITAL_COMMUNITY): Payer: Self-pay

## 2024-06-10 ENCOUNTER — Other Ambulatory Visit (HOSPITAL_COMMUNITY): Payer: Self-pay

## 2024-08-10 ENCOUNTER — Encounter: Payer: Self-pay | Admitting: Family Medicine
